# Patient Record
Sex: Female | Born: 1965 | Race: White | Hispanic: No | Smoking: Never smoker
Health system: Southern US, Community
[De-identification: ages and names within clinical notes are randomized; demographics above are authoritative.]

## PROBLEM LIST (undated history)

## (undated) ENCOUNTER — Ambulatory Visit: Discharge status: Absent without leave | Payer: Medicaid Other | Source: Home / Self Care

## (undated) DIAGNOSIS — N200 Calculus of kidney: Secondary | ICD-10-CM

## (undated) DIAGNOSIS — F4310 Post-traumatic stress disorder, unspecified: Secondary | ICD-10-CM

## (undated) DIAGNOSIS — F329 Major depressive disorder, single episode, unspecified: Secondary | ICD-10-CM

## (undated) DIAGNOSIS — D649 Anemia, unspecified: Secondary | ICD-10-CM

## (undated) DIAGNOSIS — F988 Other specified behavioral and emotional disorders with onset usually occurring in childhood and adolescence: Secondary | ICD-10-CM

## (undated) DIAGNOSIS — I499 Cardiac arrhythmia, unspecified: Secondary | ICD-10-CM

## (undated) DIAGNOSIS — F419 Anxiety disorder, unspecified: Secondary | ICD-10-CM

## (undated) DIAGNOSIS — F32A Depression, unspecified: Secondary | ICD-10-CM

## (undated) HISTORY — DX: Anemia, unspecified: D64.9

## (undated) HISTORY — DX: Depression, unspecified: F32.A

## (undated) HISTORY — DX: Calculus of kidney: N20.0

## (undated) HISTORY — DX: Post-traumatic stress disorder, unspecified: F43.10

## (undated) HISTORY — DX: Major depressive disorder, single episode, unspecified: F32.9

## (undated) HISTORY — PX: TENNIS ELBOW RELEASE/NIRSCHEL PROCEDURE: SHX6651

## (undated) HISTORY — PX: ABDOMINAL HYSTERECTOMY: SHX81

## (undated) HISTORY — PX: TUBAL LIGATION: SHX77

---

## 2013-07-19 ENCOUNTER — Emergency Department: Payer: Self-pay | Admitting: Emergency Medicine

## 2013-11-19 ENCOUNTER — Ambulatory Visit: Payer: Self-pay | Admitting: Family Medicine

## 2014-01-31 ENCOUNTER — Emergency Department: Payer: Self-pay | Admitting: Emergency Medicine

## 2014-08-22 ENCOUNTER — Ambulatory Visit
Admission: EM | Admit: 2014-08-22 | Discharge: 2014-08-22 | Disposition: A | Discharge status: Home / Self Care | Payer: Medicaid Other | Attending: Internal Medicine | Admitting: Internal Medicine

## 2014-08-22 DIAGNOSIS — F988 Other specified behavioral and emotional disorders with onset usually occurring in childhood and adolescence: Secondary | ICD-10-CM | POA: Insufficient documentation

## 2014-08-22 DIAGNOSIS — T50905A Adverse effect of unspecified drugs, medicaments and biological substances, initial encounter: Secondary | ICD-10-CM

## 2014-08-22 DIAGNOSIS — R001 Bradycardia, unspecified: Secondary | ICD-10-CM | POA: Diagnosis not present

## 2014-08-22 DIAGNOSIS — F419 Anxiety disorder, unspecified: Secondary | ICD-10-CM | POA: Diagnosis not present

## 2014-08-22 DIAGNOSIS — I499 Cardiac arrhythmia, unspecified: Secondary | ICD-10-CM | POA: Insufficient documentation

## 2014-08-22 DIAGNOSIS — R002 Palpitations: Secondary | ICD-10-CM | POA: Diagnosis present

## 2014-08-22 HISTORY — DX: Cardiac arrhythmia, unspecified: I49.9

## 2014-08-22 HISTORY — DX: Anxiety disorder, unspecified: F41.9

## 2014-08-22 HISTORY — DX: Other specified behavioral and emotional disorders with onset usually occurring in childhood and adolescence: F98.8

## 2014-08-22 MED ORDER — ATENOLOL 25 MG PO TABS: 12.5000 mg | ORAL_TABLET | Freq: Every day | ORAL | Status: DC

## 2014-08-22 NOTE — ED Notes (Signed)
[facility]. Put on Atenolol. Has appt with new PMD 09/06/14. States "feels winded when rhythm irregular." Denies chest pain. Skin pink, warm and dry.

## 2014-08-22 NOTE — ED Provider Notes (Signed)
CSN: [id number]     Arrival date & time 08/22/14  1225 History   First MD Initiated Contact with Patient 08/22/14 1331     Chief Complaint  Patient presents with  . Medication Refill   (Consider location/radiation/quality/duration/timing/severity/associated sxs/prior Treatment) HPI   49 year old female who presents with her daughter requesting an atenolol refill. Evidently she has been having palpitations was prescribed the atenolol to lessen these. Evidently she has been having occasional and intermittent shortness of breath and sweating and left arm tingling. On April 18 she was seen at the [facility] emergency room where she had a workup including an EKG troponins cardiac monitoring. At some point she had a Holter monitor placed which showed episodes of SVT. She was seen 2 weeks ago at [facility] for left arm tingling and EKG was obtained was normal and she was told by the ER physician that the tingling was most likely coming from a pinched nerve in her neck. An EKG indicated normal sinus rhythm according to the patient and that was the doctor that prescribed the atenolol. She has an appointment on 623 appointment at primary care for workup. Today she is not having any symptoms.  Past Medical History  Diagnosis Date  . Cardiac arrhythmia   . Attention deficit disorder (ADD)   . Anxiety    Past Surgical History  Procedure Laterality Date  . Tubal ligation     Family History  Problem Relation Age of Onset  . Adopted: Yes   History  Substance Use Topics  . Smoking status: Never Smoker   . Smokeless tobacco: Not on file  . Alcohol Use: No   OB History    No data available     Review of Systems  Constitutional: Positive for fatigue.  Respiratory: Positive for shortness of breath.   Cardiovascular: Positive for palpitations.  Neurological: Positive for dizziness.  All other systems reviewed and are negative.   Allergies  Codeine; Morphine and related; Oxycodone; and Oxycontin  Home  Medications   Prior to Admission medications   Medication Sig Start Date End Date Taking? Authorizing Provider  atenolol (TENORMIN) 25 MG tablet Take 0.5 tablets (12.5 mg total) by mouth daily. 08/22/14   Lutricia Feil, PA-C  atenolol (TENORMIN) 25 MG tablet Take 0.5 tablets (12.5 mg total) by mouth daily. 08/22/14   Chrissie Noa Roemer, PA-C   BP 141/76 mmHg  Pulse 55  Temp(Src) 98.2 F (36.8 C) (Oral)  Resp 18  Ht  (1.6 m)  Wt 190 lb (86.183 kg)  BMI 33.67 kg/m2  SpO2 100% Physical Exam  Constitutional: She is oriented to person, place, and time. She appears well-developed and well-nourished.  HENT:  Head: Normocephalic and atraumatic.  Eyes: EOM are normal. Pupils are equal, round, and reactive to light.  Cardiovascular:  Exam admission shows the patient in no distress. Carotid arteries show no bruits present. Lungs are clear to auscultation bilaterally. Auscultation of the heart shows no murmur no gallop. Is a normal S1-S2. The patient has a very slow rhythm.  Neurological: She is alert and oriented to person, place, and time. She has normal reflexes.  Skin: Skin is warm.  Psychiatric: She has a normal mood and affect. Her behavior is normal. Judgment and thought content normal.    ED Course  Procedures (including critical care time) Labs Review Labs Reviewed - No data to display  Imaging Review No results found.  EKG performed today because of profound bradycardia shows ventricular rate of 49 and  axis is normal. QTC is measured at 417 ms T-wave inversions are seen in lead 3 and aVR and otherwise there is no acute changes seen. MDM   1. Bradycardia, drug induced     New Prescriptions   ATENOLOL (TENORMIN) 25 MG TABLET    Take 0.5 tablets (12.5 mg total) by mouth daily.  Plan: 1. Test/x-ray results and diagnosis reviewed with patient 2. rx as per orders; risks, benefits, potential side effects reviewed with patient 3. Recommend supportive treatment with  4. F/u prn  if symptoms worsen or don't improve I advised the patient and her daughter that she needs to follow-up soon as possible as her symptoms that she's been having. It was recommended that they decrease the atenolol to one half dose daily cause of her bradycardia. If she has any increase in her symptoms or any change needs to go immediately to the emergency department.  Lutricia FeilWilliam P Roemer, PA-C 08/22/14 1414  Lutricia FeilWilliam P Roemer, PA-C 08/22/14 1432

## 2016-02-21 ENCOUNTER — Ambulatory Visit: Payer: Medicaid Other | Admitting: Physical Therapy

## 2016-03-03 ENCOUNTER — Ambulatory Visit: Payer: Medicaid Other | Admitting: Physical Therapy

## 2017-04-07 ENCOUNTER — Other Ambulatory Visit: Payer: Self-pay

## 2017-04-07 ENCOUNTER — Ambulatory Visit (INDEPENDENT_AMBULATORY_CARE_PROVIDER_SITE_OTHER): Payer: BLUE CROSS/BLUE SHIELD | Admitting: Nurse Practitioner

## 2017-04-07 ENCOUNTER — Encounter: Payer: Self-pay | Admitting: Nurse Practitioner

## 2017-04-07 VITALS — BP 137/69 | HR 63 | Temp 98.1°F | Ht 63.25 in | Wt 208.2 lb

## 2017-04-07 DIAGNOSIS — B349 Viral infection, unspecified: Secondary | ICD-10-CM | POA: Diagnosis not present

## 2017-04-07 DIAGNOSIS — G43019 Migraine without aura, intractable, without status migrainosus: Secondary | ICD-10-CM

## 2017-04-07 DIAGNOSIS — Z1231 Encounter for screening mammogram for malignant neoplasm of breast: Secondary | ICD-10-CM

## 2017-04-07 DIAGNOSIS — I471 Supraventricular tachycardia, unspecified: Secondary | ICD-10-CM

## 2017-04-07 DIAGNOSIS — Z1239 Encounter for other screening for malignant neoplasm of breast: Secondary | ICD-10-CM

## 2017-04-07 DIAGNOSIS — K219 Gastro-esophageal reflux disease without esophagitis: Secondary | ICD-10-CM | POA: Diagnosis not present

## 2017-04-07 DIAGNOSIS — Z1211 Encounter for screening for malignant neoplasm of colon: Secondary | ICD-10-CM

## 2017-04-07 MED ORDER — BUTALBITAL-APAP-CAFFEINE 50-325-40 MG PO TABS: 0.5000 | ORAL_TABLET | Freq: Two times a day (BID) | ORAL | 0 refills | Status: DC | PRN

## 2017-04-07 MED ORDER — OMEPRAZOLE 20 MG PO CPDR: 20.0000 mg | DELAYED_RELEASE_CAPSULE | Freq: Every day | ORAL | 2 refills | Status: DC

## 2017-04-07 MED ORDER — PROPRANOLOL HCL 10 MG PO TABS: 10.0000 mg | ORAL_TABLET | Freq: Two times a day (BID) | ORAL | 3 refills | Status: DC

## 2017-04-07 NOTE — Progress Notes (Signed)
Subjective:    Patient ID: Holly Bryant, female    DOB: November 14, 1965, 52 y.o.   MRN: [id number]  Holly Bryant is a 52 y.o. female presenting on 04/07/2017 for Establish Care (elevated bp) and Cough (x 3 days. Symptoms started with fever, diarrhea, vomiting and sweating. x 3 days)   HPI Establish Care New Provider Pt last seen by PCP many years ago, but has had care most recently through OB-GYN at [facility].  Obtain records from care everywhere. - Pt also sees Cardiology for palpiatations/SVT (160) has used atenolol for management.  Only lasts 10-15 seconds.  Worse with stress.  Skips a beat, flutters, then back to normal. No significant findings per pt on last holter monitor.  - Hypertension:  Readings 180/80 at work once about 2 weeks ago, but usually controlled - Migraines: fioricet - Still gets migraines, but high BP with migraines.  As long as BP is controlled, migraine frequency is much less. - GERD: Pt reports significant heartburn with nighttime awakenings 1-2x  Per night, wakes up 3 am and takes 1 hr to return to sleep.  Acute illness Pt reports now resolving acute illness that was associated with following symptoms: cough, fever, chills, sweats, diarrhea, vomiting.  She is now improving and believes this to be resolving.  Past Medical History:  Diagnosis Date  . Anemia    before hysterectomy  . Anxiety   . Attention deficit disorder (ADD)   . Cardiac arrhythmia    SVT - followed by PCP  . Depression   . Nephrolithiasis   . PTSD (post-traumatic stress disorder)    Past Surgical History:  Procedure Laterality Date  . ABDOMINAL HYSTERECTOMY    . COLONOSCOPY WITH PROPOFOL N/A 04/21/2017   Procedure: COLONOSCOPY WITH PROPOFOL;  Surgeon: Toney Reil, MD;  Location: [facility];  Service: Endoscopy;  Laterality: N/A;  . TENNIS ELBOW RELEASE/NIRSCHEL PROCEDURE Right   . TUBAL LIGATION     Social History   Socioeconomic History  . Marital status: Married      Spouse name: Not on file  . Number of children: Not on file  . Years of education: Not on file  . Highest education level: Not on file  Social Needs  . Financial resource strain: Not on file  . Food insecurity - worry: Not on file  . Food insecurity - inability: Not on file  . Transportation needs - medical: Not on file  . Transportation needs - non-medical: Not on file  Occupational History  . Not on file  Tobacco Use  . Smoking status: Never Smoker  . Smokeless tobacco: Never Used  Substance and Sexual Activity  . Alcohol use: No  . Drug use: No  . Sexual activity: Yes  Other Topics Concern  . Not on file  Social History Narrative  . Not on file   Family History  Adopted: Yes  Problem Relation Age of Onset  . Colon cancer Mother   . Heart attack Mother   . Colon cancer Brother 50  . Chronic Renal Failure Maternal Grandmother   . Diabetes Maternal Grandmother    Current Outpatient Medications on File Prior to Visit  Medication Sig  . Multiple Vitamin (MULTIVITAMIN) tablet Take 1 tablet by mouth daily.   No current facility-administered medications on file prior to visit.     Review of Systems  Constitutional: Positive for chills, diaphoresis and fever. Negative for activity change and unexpected weight change.  HENT: Positive for congestion and  rhinorrhea.   Eyes: Negative.   Respiratory: Positive for cough. Negative for shortness of breath and wheezing.   Cardiovascular: Positive for palpitations.  Gastrointestinal: Positive for abdominal pain (regular heartburn), diarrhea and nausea.  Endocrine: Negative.   Genitourinary: Negative.   Musculoskeletal: Negative.   Skin: Negative.   Allergic/Immunologic: Negative.   Neurological: Positive for headaches (migraine history, but none currently).  Hematological: Negative.   Psychiatric/Behavioral: Negative.    Per HPI unless specifically indicated above      Objective:    BP 137/69 (BP Location: Right Arm,  Patient Position: Sitting, Cuff Size: Large)   Pulse 63   Temp 98.1 F (36.7 C) (Oral)   Ht 5' 3.25" (1.607 m)   Wt 208 lb 3.2 oz (94.4 kg)   BMI 36.59 kg/m   Wt Readings from Last 3 Encounters:  04/21/17 203 lb (92.1 kg)  04/07/17 208 lb 3.2 oz (94.4 kg)  08/22/14 190 lb (86.2 kg)    Physical Exam  General - obese, well-appearing, NAD HEENT - Normocephalic, atraumatic, PERRL, EOMI, patent nares w/o congestion, oropharynx clear, MMM, TM normal, Ear canal normal, external ear normal w/o lesions Neck - supple, non-tender, no cervical LAD, no thyromegaly, no carotid bruit Heart - RRR, no murmurs heard Lungs - Clear throughout all lobes, no wheezing, crackles, or rhonchi. Normal work of breathing. Abdomen - soft, NTND, no masses, no hepatosplenomegaly, active bowel sounds Extremeties - non-tender, no edema, cap refill < 2 seconds, peripheral pulses intact +2 bilaterally Skin - warm, dry, no rashes Neuro - awake, alert, oriented x3, normal gait Psych - Normal mood and affect, normal behavior      Assessment & Plan:   Problem List Items Addressed This Visit    None    Visit Diagnoses    SVT (supraventricular tachycardia) (HCC)    -  Primary Currently well controlled, but pt experiencing continued episodes of intermittent palpitations which could be SVT.  Pt is not currently taking any medications.  Plan: 1. START propranolol 10 mg bid for palpitations.  May also have secondary benefit of migraine prophylaxis. 2. Begin physical activity slowly.  Increased to 30 minutes most days of week. 3. Followup 6 weeks   Relevant Medications   propranolol (INDERAL) 10 MG tablet   Intractable migraine without aura and without status migrainosus     Consistent with episodic migraine HA  - Currently without active HA, well-appearing, no focal neuro deficits, tolerating PO w/o n/v - Inadequately treated for migraine HA at home, but has partial response with treatment by fioricet 1/2- 1 tablet  prn.  Plan: 1. Continue abortive therapy with Firoicet 1/2-1 tablet po prn migraine at onset.  Repeat x 1 additional dose in 24 hours if needed. Refill provided - Recommend taking Tylenol 1000mg  TID alternatively 2. START propranolol as above.  Can consider higher dose as needed for migraine prophylaxis. 3. Avoid triggers including foods, caffeine. Important to rest. 4. Start headache diary, handout given, identify triggers for avoidance, bring to next visit 5. Return criteria given for acute migraine, when to go to office vs ED   Relevant Medications   propranolol (INDERAL) 10 MG tablet   butalbital-acetaminophen-caffeine (FIORICET, ESGIC) 50-325-40 MG tablet   Colon cancer screening     Pt requiring colon cancer screening.  No family history of colon cancer.  Plan: - Discussed timing for initiation of colon cancer screening ACS vs USPSTF guidelines - Mutual decision making discussion for options of colonoscopy vs cologuard.  Pt prefers colonoscopy.  -  Referral to GI placed.   Relevant Orders   Ambulatory referral to Gastroenterology   Breast cancer screening     Pt last mammogram > 2 years ago in 2012.  Plan: 1. Screening mammogram order placed.  Pt will call to schedule appointment.  Information given.   Relevant Orders   MM DIGITAL SCREENING BILATERAL   Gastroesophageal reflux disease, esophagitis presence not specified     Currently uncontrolled on no medication.  Pt frequently eating late at night prior to bed.  Plan: 1. Start omeprazole 20 mg once daily x 14 days.  Consider long-term use in future if effective. Side effects discussed. Pt wants to continue med. 2. Avoid diet triggers. Reviewed need to seek care if globus sensation, difficulty swallowing, s/sx of GI bleed. 3. Follow up as needed and in 6 months.    Relevant Medications   omeprazole (PRILOSEC) 20 MG capsule   Acute viral syndrome     Resolving.  Pt is not currently concerned and feels it will not need any  additional treatment.      Meds ordered this encounter  Medications  . propranolol (INDERAL) 10 MG tablet    Sig: Take 1 tablet (10 mg total) by mouth 2 (two) times daily.    Dispense:  60 tablet    Refill:  3    Order Specific Question:   Supervising Provider    Answer:   Smitty CordsKARAMALEGOS, ALEXANDER J [[id number]]  . butalbital-acetaminophen-caffeine (FIORICET, ESGIC) 50-325-40 MG tablet    Sig: Take 0.5-1 tablets by mouth 2 (two) times daily as needed for migraine.    Dispense:  14 tablet    Refill:  0    Order Specific Question:   Supervising Provider    Answer:   Smitty CordsKARAMALEGOS, ALEXANDER J [[id number]]  . omeprazole (PRILOSEC) 20 MG capsule    Sig: Take 1 capsule (20 mg total) by mouth daily.    Dispense:  30 capsule    Refill:  2    Order Specific Question:   Supervising Provider    Answer:   Smitty CordsKARAMALEGOS, ALEXANDER J [[id number]]      Follow up plan: Return in about 3 months (around 07/06/2017) for annual physical AND 6 weeks for palpitations.  Wilhelmina McardleLauren Hoover Grewe, DNP, AGPCNP-BC Adult Gerontology Primary Care Nurse Practitioner [facility] 05/03/2017, 1:08 PM

## 2017-04-07 NOTE — Patient Instructions (Signed)
Holly Bryant, Thank you for coming in to clinic today.  1. For your heartburn:  - START omeprazole 20 mg once daily for 14 days.  If you continue having heartburn, can continue this for up to 3 months.  2. For heartrate: - START propranolol 10 mg twice daily to prevent SVT/heart racing. - May begin physcial activity slowly with 15 minutes followed by rest. Increase to 30-40 minutes max per session by next visit.  3. Migraines: - Resume fioricet 1/2 -1 tablet as needed at onset of migraine.  May take twice daily if needed beyond single dose.  4. Your mammogram order has been placed.  Call the Scheduling phone number at [phone] to schedule your mammogram at your convenience.  You can choose to go to either location listed below.  Let the scheduler know which location you prefer.  [facility]  [facility]  [address]  [place], [place] [address]   [facility] [place] [facility] [address] [place], [place] [address]  5. For colonoscopy:  - [facility] in [place] will call to schedule your appointment.   Please schedule a follow-up appointment with Wilhelmina McardleLauren Bowe Sidor, AGNP. Return in about 3 months (around 07/06/2017) for annual physical AND 6 weeks for palpitations.  If you have any other questions or concerns, please feel free to call the clinic or send a message through MyChart. You may also schedule an earlier appointment if necessary.  You will receive a survey after today's visit either digitally by e-mail or paper by [employer] mail. Your experiences and feedback matter to us.  Please respond so we know how we are doing as we provide care for you.   Wilhelmina McardleLauren Alzina Golda, DNP, AGNP-BC Adult Gerontology Nurse Practitioner [facility], [facility]

## 2017-04-09 ENCOUNTER — Other Ambulatory Visit: Payer: Self-pay

## 2017-04-09 ENCOUNTER — Telehealth: Payer: Self-pay

## 2017-04-09 DIAGNOSIS — Z1211 Encounter for screening for malignant neoplasm of colon: Secondary | ICD-10-CM

## 2017-04-09 NOTE — Telephone Encounter (Signed)
Gastroenterology Pre-Procedure Review  Request Date: 04/21/17 Requesting Physician: Dr. Allegra LaiVanga  PATIENT REVIEW QUESTIONS: The patient responded to the following health history questions as indicated:    1. Are you having any GI issues? no 2. Do you have a personal history of Polyps? no 3. Do you have a family history of Colon Cancer or Polyps? yes (sister polyps mom colon cancer) 4. Diabetes Mellitus? no 5. Joint replacements in the past 12 months?no 6. Major health problems in the past 3 months?yes (svt 04/07/17) 7. Any artificial heart valves, MVP, or defibrillator?no    MEDICATIONS & ALLERGIES:    Patient reports the following regarding taking any anticoagulation/antiplatelet therapy:   Plavix, Coumadin, Eliquis, Xarelto, Lovenox, Pradaxa, Brilinta, or Effient? no Aspirin? yes (81 mg)  Patient confirms/reports the following medications:  Current Outpatient Medications  Medication Sig Dispense Refill  . butalbital-acetaminophen-caffeine (FIORICET, ESGIC) 50-325-40 MG tablet Take 0.5-1 tablets by mouth 2 (two) times daily as needed for migraine. 14 tablet 0  . Multiple Vitamin (MULTIVITAMIN) tablet Take 1 tablet by mouth daily.    Marland Kitchen. omeprazole (PRILOSEC) 20 MG capsule Take 1 capsule (20 mg total) by mouth daily. 30 capsule 2  . propranolol (INDERAL) 10 MG tablet Take 1 tablet (10 mg total) by mouth 2 (two) times daily. 60 tablet 3   No current facility-administered medications for this visit.     Patient confirms/reports the following allergies:  Allergies  Allergen Reactions  . Codeine Nausea Only  . Morphine And Related Nausea Only  . Oxycodone Nausea And Vomiting  . Oxycontin [Oxycodone Hcl] Nausea Only  . Sumatriptan Other (See Comments)    No orders of the defined types were placed in this encounter.   AUTHORIZATION INFORMATION Primary Insurance: 1D#: Group #:  Secondary Insurance: 1D#: Group #:  SCHEDULE INFORMATION: Date: 04/21/17 Time: Location:[facility]

## 2017-04-13 ENCOUNTER — Encounter: Payer: Self-pay | Admitting: Nurse Practitioner

## 2017-04-15 ENCOUNTER — Encounter: Payer: Self-pay | Admitting: *Deleted

## 2017-04-15 ENCOUNTER — Other Ambulatory Visit: Payer: Self-pay

## 2017-04-20 NOTE — Discharge Instructions (Signed)
General Anesthesia, Adult, Care After °These instructions provide you with information about caring for yourself after your procedure. Your health care provider may also give you more specific instructions. Your treatment has been planned according to current medical practices, but problems sometimes occur. Call your health care provider if you have any problems or questions after your procedure. °What can I expect after the procedure? °After the procedure, it is common to have: °· Vomiting. °· A sore throat. °· Mental slowness. ° °It is common to feel: °· Nauseous. °· Cold or shivery. °· Sleepy. °· Tired. °· Sore or achy, even in parts of your body where you did not have surgery. ° °Follow these instructions at home: °For at least 24 hours after the procedure: °· Do not: °? Participate in activities where you could fall or become injured. °? Drive. °? Use heavy machinery. °? Drink alcohol. °? Take sleeping pills or medicines that cause drowsiness. °? Make important decisions or sign legal documents. °? Take care of children on your own. °· Rest. °Eating and drinking °· If you vomit, drink water, juice, or soup when you can drink without vomiting. °· Drink enough fluid to keep your urine clear or pale yellow. °· Make sure you have little or no nausea before eating solid foods. °· Follow the diet recommended by your health care provider. °General instructions °· Have a responsible adult stay with you until you are awake and alert. °· Return to your normal activities as told by your health care provider. Ask your health care provider what activities are safe for you. °· Take over-the-counter and prescription medicines only as told by your health care provider. °· If you smoke, do not smoke without supervision. °· Keep all follow-up visits as told by your health care provider. This is important. °Contact a health care provider if: °· You continue to have nausea or vomiting at home, and medicines are not helpful. °· You  cannot drink fluids or start eating again. °· You cannot urinate after 8-12 hours. °· You develop a skin rash. °· You have fever. °· You have increasing redness at the site of your procedure. °Get help right away if: °· You have difficulty breathing. °· You have chest pain. °· You have unexpected bleeding. °· You feel that you are having a life-threatening or urgent problem. °This information is not intended to replace advice given to you by your health care provider. Make sure you discuss any questions you have with your health care provider. °Document Released: 06/08/2000 Document Revised: 08/05/2015 Document Reviewed: 02/14/2015 °Elsevier Interactive Patient Education © 2018 [employer] ° °

## 2017-04-21 ENCOUNTER — Ambulatory Visit
Admission: RE | Admit: 2017-04-21 | Discharge: 2017-04-21 | Disposition: A | Discharge status: Home / Self Care | Payer: BLUE CROSS/BLUE SHIELD | Source: Ambulatory Visit | Attending: Gastroenterology | Admitting: Gastroenterology

## 2017-04-21 ENCOUNTER — Ambulatory Visit: Payer: BLUE CROSS/BLUE SHIELD | Admitting: Anesthesiology

## 2017-04-21 ENCOUNTER — Encounter
Admission: RE | Disposition: A | Discharge status: Home / Self Care | Payer: Self-pay | Source: Ambulatory Visit | Attending: Gastroenterology

## 2017-04-21 DIAGNOSIS — Z1211 Encounter for screening for malignant neoplasm of colon: Secondary | ICD-10-CM | POA: Diagnosis present

## 2017-04-21 DIAGNOSIS — Z800 Family history of malignant neoplasm of digestive organs: Secondary | ICD-10-CM

## 2017-04-21 DIAGNOSIS — Z7982 Long term (current) use of aspirin: Secondary | ICD-10-CM | POA: Diagnosis not present

## 2017-04-21 HISTORY — PX: COLONOSCOPY WITH PROPOFOL: SHX5780

## 2017-04-21 SURGERY — COLONOSCOPY WITH PROPOFOL
Anesthesia: General | Wound class: Contaminated

## 2017-04-21 MED ORDER — PROMETHAZINE HCL 25 MG/ML IJ SOLN: 6.2500 mg | INTRAMUSCULAR | Status: DC | PRN

## 2017-04-21 MED ORDER — LACTATED RINGERS IV SOLN
INTRAVENOUS | Status: DC
  Administered 2017-04-21: 11:00:00 via INTRAVENOUS

## 2017-04-21 MED ORDER — STERILE WATER FOR IRRIGATION IR SOLN
Status: DC | PRN
  Administered 2017-04-21: 12:00:00

## 2017-04-21 MED ORDER — LIDOCAINE HCL (CARDIAC) 20 MG/ML IV SOLN
INTRAVENOUS | Status: DC | PRN
  Administered 2017-04-21: 40 mg via INTRAVENOUS

## 2017-04-21 MED ORDER — FENTANYL CITRATE (PF) 100 MCG/2ML IJ SOLN: 25.0000 ug | INTRAMUSCULAR | Status: DC | PRN

## 2017-04-21 MED ORDER — MEPERIDINE HCL 25 MG/ML IJ SOLN: 6.2500 mg | INTRAMUSCULAR | Status: DC | PRN

## 2017-04-21 MED ORDER — PROPOFOL 10 MG/ML IV BOLUS
INTRAVENOUS | Status: DC | PRN
  Administered 2017-04-21 (×2): 30 mg via INTRAVENOUS
  Administered 2017-04-21: 20 mg via INTRAVENOUS
  Administered 2017-04-21: 40 mg via INTRAVENOUS
  Administered 2017-04-21: 20 mg via INTRAVENOUS
  Administered 2017-04-21: 30 mg via INTRAVENOUS
  Administered 2017-04-21: 90 mg via INTRAVENOUS
  Administered 2017-04-21: 30 mg via INTRAVENOUS
  Administered 2017-04-21: 20 mg via INTRAVENOUS
  Administered 2017-04-21: 40 mg via INTRAVENOUS
  Administered 2017-04-21 (×3): 30 mg via INTRAVENOUS

## 2017-04-21 SURGICAL SUPPLY — 25 items
CANISTER SUCT 1200ML W/VALVE (MISCELLANEOUS) ×3 IMPLANT
CLIP HMST 235XBRD CATH ROT (MISCELLANEOUS) IMPLANT
CLIP RESOLUTION 360 11X235 (MISCELLANEOUS)
ELECT REM PT RETURN 9FT ADLT (ELECTROSURGICAL)
ELECTRODE REM PT RTRN 9FT ADLT (ELECTROSURGICAL) IMPLANT
FCP ESCP3.2XJMB 240X2.8X (MISCELLANEOUS)
FORCEPS BIOP RAD 4 LRG CAP 4 (CUTTING FORCEPS) IMPLANT
FORCEPS BIOP RJ4 240 W/NDL (MISCELLANEOUS)
FORCEPS ESCP3.2XJMB 240X2.8X (MISCELLANEOUS) IMPLANT
GOWN CVR UNV OPN BCK APRN NK (MISCELLANEOUS) ×2 IMPLANT
GOWN ISOL THUMB LOOP REG UNIV (MISCELLANEOUS) ×4
INJECTOR VARIJECT VIN23 (MISCELLANEOUS) IMPLANT
KIT DEFENDO VALVE AND CONN (KITS) IMPLANT
KIT ENDO PROCEDURE OLY (KITS) ×3 IMPLANT
MARKER SPOT ENDO TATTOO 5ML (MISCELLANEOUS) IMPLANT
PROBE APC STR FIRE (PROBE) IMPLANT
RETRIEVER NET ROTH 2.5X230 LF (MISCELLANEOUS) IMPLANT
SNARE COLD EXACTO (MISCELLANEOUS) IMPLANT
SNARE SHORT THROW 13M SML OVAL (MISCELLANEOUS) IMPLANT
SNARE SHORT THROW 30M LRG OVAL (MISCELLANEOUS) IMPLANT
SNARE SNG USE RND 15MM (INSTRUMENTS) IMPLANT
SPOT EX ENDOSCOPIC TATTOO (MISCELLANEOUS)
TRAP ETRAP POLY (MISCELLANEOUS) IMPLANT
VARIJECT INJECTOR VIN23 (MISCELLANEOUS)
WATER STERILE IRR 250ML POUR (IV SOLUTION) ×3 IMPLANT

## 2017-04-21 NOTE — Anesthesia Procedure Notes (Signed)
Performed by: Maree KrabbeWarren, Trajon Rosete, CRNA Pre-anesthesia Checklist: Patient identified, Emergency Drugs available, Suction available, Patient being monitored and Timeout performed Patient Re-evaluated:Patient Re-evaluated prior to induction Oxygen Delivery Method: Nasal cannula Placement Confirmation: breath sounds checked- equal and bilateral and positive ETCO2

## 2017-04-21 NOTE — Op Note (Signed)
[facility] Patient Name: Holly Bryant Procedure Date: 04/21/2017 11:45 AM MRN: [id number] Account #: [id number] Date of Birth: July 04, 1965 Admit Type: Outpatient Age: 52 Room: [facility] OR ROOM 01 Gender: Female Note Status: Finalized Procedure:            Colonoscopy Indications:          Screening in patient at increased risk: Colorectal                        cancer in mother 82 or older, Screening in patient at                        increased risk: Colorectal cancer in brother before age                        16 Providers:            Daisee Centner Raeanne Gathers MD, MD Referring MD:         Olin Hauser (Referring MD) Medicines:            Monitored Anesthesia Care Complications:        No immediate complications. Estimated blood loss: None. Procedure:            Pre-Anesthesia Assessment:                       - Prior to the procedure, a History and Physical was                        performed, and patient medications and allergies were                        reviewed. The patient is competent. The risks and                        benefits of the procedure and the sedation options and                        risks were discussed with the patient. All questions                        were answered and informed consent was obtained.                        Patient identification and proposed procedure were                        verified by the physician, the nurse, the                        anesthesiologist, the anesthetist and the technician in                        the pre-procedure area in the procedure room. Mental                        Status Examination: alert and oriented. Airway                        Examination: normal oropharyngeal airway and neck  mobility. Respiratory Examination: clear to                        auscultation. CV Examination: normal. Prophylactic                        Antibiotics: The  patient does not require prophylactic                        antibiotics. Prior Anticoagulants: The patient has                        taken no previous anticoagulant or antiplatelet agents.                        ASA Grade Assessment: II - A patient with mild systemic                        disease. After reviewing the risks and benefits, the                        patient was deemed in satisfactory condition to undergo                        the procedure. The anesthesia plan was to use monitored                        anesthesia care (MAC). Immediately prior to                        administration of medications, the patient was                        re-assessed for adequacy to receive sedatives. The                        heart rate, respiratory rate, oxygen saturations, blood                        pressure, adequacy of pulmonary ventilation, and                        response to care were monitored throughout the                        procedure. The physical status of the patient was                        re-assessed after the procedure.                       After obtaining informed consent, the colonoscope was                        passed under direct vision. Throughout the procedure,                        the patient's blood pressure, pulse, and oxygen                        saturations were monitored  continuously. The Olympus                        CF-HQ190L Colonoscope (S#. [phone]) was introduced                        through the anus and advanced to the the cecum,                        identified by appendiceal orifice and ileocecal valve.                        The colonoscopy was performed without difficulty. The                        patient tolerated the procedure well. The quality of                        the bowel preparation was poor. Findings:      The perianal and digital rectal examinations were normal. Pertinent       negatives include normal sphincter  tone and no palpable rectal lesions.      A large amounts of thick adherent semi-liquid stool was found in the       transverse colon, in the ascending colon and in the cecum, interfering       with visualization, unabe to wash off with avage.      No tumor or large polyps were identified      Unable to perform retrofexion      The exam was otherwise without abnormality. Impression:           - Preparation of the colon was poor.                       - Stool in the transverse colon, in the ascending colon                        and in the cecum.                       - The examination was otherwise normal.                       - No specimens collected. Recommendation:       - Repeat colonoscopy at the next available appointment                        because the bowel preparation was poor.                       - Recommend 2 day bowe prep Procedure Code(s):    --- Professional ---                       X7353, Colorectal cancer screening; colonoscopy on                        individual at high risk Diagnosis Code(s):    --- Professional ---                       Z80.0, Family history of malignant neoplasm of  digestive organs CPT copyright 2016 American Medical Association. All rights reserved. The codes documented in this report are preliminary and upon coder review may  be revised to meet current compliance requirements. Dr. Ulyess Mort Lin Landsman MD, MD 04/21/2017 12:14:23 PM This report has been signed electronically. Number of Addenda: 0 Note Initiated On: 04/21/2017 11:45 AM Scope Withdrawal Time: 0 hours 7 minutes 29 seconds  Total Procedure Duration: 0 hours 12 minutes 56 seconds       [facility]

## 2017-04-21 NOTE — Transfer of Care (Signed)
Immediate Anesthesia Transfer of Care Note  [facility]  Procedure(s) Performed: COLONOSCOPY WITH PROPOFOL (N/A )  Patient Location: PACU  Anesthesia Type: General  Level of Consciousness: awake, alert  and patient cooperative  Airway and Oxygen Therapy: Patient Spontanous Breathing and Patient connected to supplemental oxygen  Post-op Assessment: Post-op Vital signs reviewed, Patient's Cardiovascular Status Stable, Respiratory Function Stable, Patent Airway and No signs of Nausea or vomiting  Post-op Vital Signs: Reviewed and stable  Complications: No apparent anesthesia complications

## 2017-04-21 NOTE — Anesthesia Postprocedure Evaluation (Signed)
Anesthesia Post Note  Patient: Holly Bryant  Procedure(s) Performed: COLONOSCOPY WITH PROPOFOL (N/A )  Patient location during evaluation: PACU Anesthesia Type: General Level of consciousness: awake and alert Pain management: pain level controlled Vital Signs Assessment: post-procedure vital signs reviewed and stable Respiratory status: spontaneous breathing, nonlabored ventilation, respiratory function stable and patient connected to nasal cannula oxygen Cardiovascular status: blood pressure returned to baseline and stable Postop Assessment: no apparent nausea or vomiting Anesthetic complications: no    SCOURAS, NICOLE ELAINE

## 2017-04-21 NOTE — Anesthesia Preprocedure Evaluation (Signed)
Anesthesia Evaluation  Patient identified by MRN, date of birth, ID band Patient awake    Reviewed: Allergy & Precautions, H&P , NPO status , Patient's Chart, lab work & pertinent test results, reviewed documented beta blocker date and time   Airway Mallampati: II  TM Distance: >3 FB Neck ROM: full    Dental no notable dental hx.    Pulmonary neg pulmonary ROS,    Pulmonary exam normal breath sounds clear to auscultation       Cardiovascular Exercise Tolerance: Good + dysrhythmias Supra Ventricular Tachycardia  Rhythm:regular Rate:Normal     Neuro/Psych PSYCHIATRIC DISORDERS (Depression. PTSD) negative neurological ROS     GI/Hepatic negative GI ROS, Neg liver ROS,   Endo/Other  negative endocrine ROS  Renal/GU negative Renal ROS  negative genitourinary   Musculoskeletal   Abdominal   Peds  Hematology  (+) anemia ,   Anesthesia Other Findings   Reproductive/Obstetrics negative OB ROS                             Anesthesia Physical Anesthesia Plan  ASA: II  Anesthesia Plan: General   Post-op Pain Management:    Induction:   PONV Risk Score and Plan:   Airway Management Planned:   Additional Equipment:   Intra-op Plan:   Post-operative Plan:   Informed Consent: I have reviewed the patients History and Physical, chart, labs and discussed the procedure including the risks, benefits and alternatives for the proposed anesthesia with the patient or authorized representative who has indicated his/her understanding and acceptance.   Dental Advisory Given  Plan Discussed with: CRNA  Anesthesia Plan Comments:         Anesthesia Quick Evaluation

## 2017-04-21 NOTE — H&P (Signed)
Arlyss Repress, MD [address]  [address]  [place], [place] [address]  Main: [phone]  Fax: [phone] Pager: [phone]  Primary Care Physician:  Galen Manila, NP Primary Gastroenterologist:  Dr. Arlyss Repress  Pre-Procedure History & Physical: HPI:  Holly Bryant is a 52 y.o. female is here for an colonoscopy.   Past Medical History:  Diagnosis Date  . Anemia    before hysterectomy  . Anxiety   . Attention deficit disorder (ADD)   . Cardiac arrhythmia    SVT - followed by PCP  . Depression   . Nephrolithiasis   . PTSD (post-traumatic stress disorder)     Past Surgical History:  Procedure Laterality Date  . ABDOMINAL HYSTERECTOMY    . TENNIS ELBOW RELEASE/NIRSCHEL PROCEDURE Right   . TUBAL LIGATION      Prior to Admission medications   Medication Sig Start Date End Date Taking? Authorizing Provider  aspirin 81 MG tablet Take 81 mg by mouth daily.   Yes [provider]  Multiple Vitamin (MULTIVITAMIN) tablet Take 1 tablet by mouth daily.   Yes [provider]  omeprazole (PRILOSEC) 20 MG capsule Take 1 capsule (20 mg total) by mouth daily. 04/07/17  Yes Galen Manila, NP  propranolol (INDERAL) 10 MG tablet Take 1 tablet (10 mg total) by mouth 2 (two) times daily. 04/07/17  Yes Galen Manila, NP  butalbital-acetaminophen-caffeine (FIORICET, ESGIC) [phone] MG tablet Take 0.5-1 tablets by mouth 2 (two) times daily as needed for migraine. 04/07/17   Galen Manila, NP    Allergies as of 04/09/2017 - Review Complete 04/07/2017  Allergen Reaction Noted  . Codeine Nausea Only 08/22/2014  . Morphine and related Nausea Only 08/22/2014  . Oxycodone Nausea And Vomiting 08/22/2014  . Oxycontin [oxycodone hcl] Nausea Only 08/22/2014  . Sumatriptan Other (See Comments) 10/17/2014    Family History  Adopted: Yes  Problem Relation Age of Onset  . Colon cancer Mother   . Heart attack Mother   . Colon cancer  Brother 50  . Chronic Renal Failure Maternal Grandmother   . Diabetes Maternal Grandmother     Social History   Socioeconomic History  . Marital status: Married    Spouse name: Not on file  . Number of children: Not on file  . Years of education: Not on file  . Highest education level: Not on file  Social Needs  . Financial resource strain: Not on file  . Food insecurity - worry: Not on file  . Food insecurity - inability: Not on file  . Transportation needs - medical: Not on file  . Transportation needs - non-medical: Not on file  Occupational History  . Not on file  Tobacco Use  . Smoking status: Never Smoker  . Smokeless tobacco: Never Used  Substance and Sexual Activity  . Alcohol use: No  . Drug use: No  . Sexual activity: Yes  Other Topics Concern  . Not on file  Social History Narrative  . Not on file    Review of Systems: See HPI, otherwise negative ROS  Physical Exam: Ht 5' 3.25" (1.607 m)   Wt 208 lb (94.3 kg)   BMI 36.55 kg/m  General:   Alert,  pleasant and cooperative in NAD Head:  Normocephalic and atraumatic. Neck:  Supple; no masses or thyromegaly. Lungs:  Clear throughout to auscultation.    Heart:  Regular rate and rhythm. Abdomen:  Soft, nontender and nondistended. Normal bowel sounds, without  guarding, and without rebound.   Neurologic:  Alert and  oriented x4;  grossly normal neurologically.  Impression/Plan: Holly Bryant is here for an colonoscopy to be performed for colon cancer screening  Risks, benefits, limitations, and alternatives regarding  colonoscopy have been reviewed with the patient.  Questions have been answered.  All parties agreeable.   Lannette Donathohini Medardo Hassing, MD  04/21/2017, 11:12 AM

## 2017-05-03 ENCOUNTER — Encounter: Payer: Self-pay | Admitting: Nurse Practitioner

## 2017-05-03 DIAGNOSIS — I471 Supraventricular tachycardia: Secondary | ICD-10-CM | POA: Insufficient documentation

## 2017-05-03 DIAGNOSIS — G43019 Migraine without aura, intractable, without status migrainosus: Secondary | ICD-10-CM | POA: Insufficient documentation

## 2017-05-03 DIAGNOSIS — K219 Gastro-esophageal reflux disease without esophagitis: Secondary | ICD-10-CM | POA: Insufficient documentation

## 2017-05-19 ENCOUNTER — Ambulatory Visit (INDEPENDENT_AMBULATORY_CARE_PROVIDER_SITE_OTHER): Payer: BLUE CROSS/BLUE SHIELD | Admitting: Nurse Practitioner

## 2017-05-19 ENCOUNTER — Other Ambulatory Visit: Payer: Self-pay

## 2017-05-19 ENCOUNTER — Encounter: Payer: Self-pay | Admitting: Nurse Practitioner

## 2017-05-19 VITALS — BP 132/77 | HR 53 | Temp 98.1°F | Ht 63.25 in | Wt 208.4 lb

## 2017-05-19 DIAGNOSIS — R002 Palpitations: Secondary | ICD-10-CM

## 2017-05-19 DIAGNOSIS — F4010 Social phobia, unspecified: Secondary | ICD-10-CM | POA: Diagnosis not present

## 2017-05-19 DIAGNOSIS — G43019 Migraine without aura, intractable, without status migrainosus: Secondary | ICD-10-CM

## 2017-05-19 MED ORDER — PROPRANOLOL HCL 10 MG PO TABS: 5.0000 mg | ORAL_TABLET | Freq: Three times a day (TID) | ORAL | 3 refills | Status: DC

## 2017-05-19 MED ORDER — BUSPIRONE HCL 7.5 MG PO TABS: 7.5000 mg | ORAL_TABLET | Freq: Three times a day (TID) | ORAL | 5 refills | Status: DC

## 2017-05-19 NOTE — Progress Notes (Signed)
Subjective:    Patient ID: Holly Bryant, female    DOB: 08-29-1965, 52 y.o.   MRN: [id number]  Holly Bryant is a 52 y.o. female presenting on 05/19/2017 for Palpitations (pt states if she doesn't take her propanlol the heart palpation returns )   HPI Palpitations Pt has had prior cardiology evaluation with holter monitor that did not show correlation of EKG changes other than few PAC/PVCs with symptoms of palpitations.  Pt had since had additional palpitations and presented to me on 04/07/2017 with more frequent palpitations.   She was started on propranolol 10 mg tid, but was unable to take full dose tid.  She is taking 1/2 tab in am, 1/2 tab midday, 1 tab at bedtime.  She had sluggishness/fatigue when taking full dose.  Notes improvement in palpitations when taking medication consistently.  If she misses her mid-day dose, she has palpitations at work and has to go take her medication.   She has also gotten a wearable heart rate tracker/exercise monitor that shows regular daily HR increases to 110-120 even without activity.  Occasionally, pt states it is happening more than once per day.  Pt does not always correlate this to palpitations, however.  Also notes no consistent time of day that they occur.   Migraines Pt notes significant improvement in her migraine symptoms since starting propranolol.  She reports no headaches.   Anxiety Pt also reports propranolol is helping social anxiety.  It allows her to calm down, but she still has overwhelming social anxiety.  She had previously taken clonazepam when working through a divorce, but does not desire to resume this medication.  Will consider long-term coverage for medication for anxiety as it is impacting her ability to function at work some days.  She resorts to leaving social situations or other circumstances where she has to interact with lots of people.  Social History   Tobacco Use  . Smoking status: Never Smoker  . Smokeless  tobacco: Never Used  Substance Use Topics  . Alcohol use: No  . Drug use: No    Review of Systems Per HPI unless specifically indicated above     Objective:    BP 132/77 (BP Location: Right Arm, Patient Position: Sitting, Cuff Size: Large)   Pulse (!) 53   Temp 98.1 F (36.7 C) (Oral)   Ht 5' 3.25" (1.607 m)   Wt 208 lb 6.4 oz (94.5 kg)   BMI 36.63 kg/m   Wt Readings from Last 3 Encounters:  05/19/17 208 lb 6.4 oz (94.5 kg)  04/21/17 203 lb (92.1 kg)  04/07/17 208 lb 3.2 oz (94.4 kg)    Physical Exam  General - obese with central adiposity, well-appearing, NAD HEENT - Normocephalic, atraumatic Neck - supple, non-tender, no LAD, no thyromegaly, no carotid bruit Heart - RRR, no murmurs heard Lungs - Clear throughout all lobes, no wheezing, crackles, or rhonchi. Normal work of breathing. Extremeties - non-tender, no edema, cap refill < 2 seconds, peripheral pulses intact +2 bilaterally Skin - warm, dry Neuro - awake, alert, oriented x3, normal gait Psych - Normal mood and affect, normal behavior        Assessment & Plan:   Problem List Items Addressed This Visit      Cardiovascular and Mediastinum   Intractable migraine without aura and without status migrainosus Improving on propranolol.  No headaches since last visit about 2 months ago.   Continue propranolol.  Considered LA, but will likely be too high  of dose of medication.   Relevant Medications   propranolol (INDERAL) 10 MG tablet    Other Visit Diagnoses    Palpitations    -  Primary Improving on propranolol.  Pt reports consistent palpitations when missing doses.  HR increases up to 110-120 multiple times daily on wristwatch style wearable HR tracker.  Plan: 1. Continue propranolol 5 mg tid.  Low dose required as pt not tolerating higher dose. 2. Repeat holter monitor - 48 hours.  Consider echo, stress test, or Cardiology referral depending on findings. 3. Labs: TSH, CMP for identification of possible  causes. 4. Followup 3 months or sooner if needed.    Relevant Medications   propranolol (INDERAL) 10 MG tablet   Other Relevant Orders   Holter monitor - 48 hour   TSH   COMPLETE METABOLIC PANEL WITH GFR   Social anxiety disorder     Improved on propranolol, but remains uncontrolled.  Pt is avoiding social situations including some work responsibilities because of her anxiety symptoms.  Possible component and contributing factor to palpitations.  Plan: 1. Continue propranolol. 2. START buspirone 7.5 mg bid.  Take 1 tablet once daily for 1 week. Then take 1 tab twice daily and continue.  Reviewed common side effects. 3. Followup 3 months.   Relevant Medications   propranolol (INDERAL) 10 MG tablet   busPIRone (BUSPAR) 7.5 MG tablet      Meds ordered this encounter  Medications  . propranolol (INDERAL) 10 MG tablet    Sig: Take 0.5 tablets (5 mg total) by mouth 3 (three) times daily.    Dispense:  135 tablet    Refill:  3    Order Specific Question:   Supervising Provider    Answer:   Smitty CordsKARAMALEGOS, ALEXANDER J [[id number]]  . busPIRone (BUSPAR) 7.5 MG tablet    Sig: Take 1 tablet (7.5 mg total) by mouth 3 (three) times daily.    Dispense:  60 tablet    Refill:  5    Order Specific Question:   Supervising Provider    Answer:   Smitty CordsKARAMALEGOS, ALEXANDER J [[id number]]    Follow up plan: Return in about 4 months (around 09/18/2017) for Palpitations.  Holly McardleLauren Isidoro Santillana, DNP, AGPCNP-BC Adult Gerontology Primary Care Nurse Practitioner [facility] 05/19/2017, 1:52 PM

## 2017-05-19 NOTE — Patient Instructions (Addendum)
Holly Bryant,  Thank you for coming in to clinic today.  1. Continue propranolol 5 mg (1/2 tablet), but change to three times daily.  2. Holter monitor.  [facility] will call you to schedule this.  3. START buspar 7.5 mg once daily for 1 week.  Increase to twice daily and continue.  Please schedule a follow-up appointment with Holly Bryant, AGNP. Return in about 4 months (around 09/18/2017) for Palpitations.  If you have any other questions or concerns, please feel free to call the clinic or send a message through MyChart. You may also schedule an earlier appointment if necessary.  You will receive a survey after today's visit either digitally by e-mail or paper by [employer] mail. Your experiences and feedback matter to us.  Please respond so we know how we are doing as we provide care for you.   Holly McardleLauren Sahasra Belue, DNP, AGNP-BC Adult Gerontology Nurse Practitioner [facility], [facility]

## 2017-05-21 ENCOUNTER — Other Ambulatory Visit: Payer: BLUE CROSS/BLUE SHIELD

## 2017-05-22 LAB — TSH: TSH: 4.02 mIU/L

## 2017-05-22 LAB — COMPLETE METABOLIC PANEL WITH GFR
AG Ratio: 1.9 (calc) (ref 1.0–2.5)
ALT: 42 U/L — ABNORMAL HIGH (ref 6–29)
AST: 27 U/L (ref 10–35)
Albumin: 4.5 g/dL (ref 3.6–5.1)
Alkaline phosphatase (APISO): 84 U/L (ref 33–130)
BUN: 13 mg/dL (ref 7–25)
CO2: 27 mmol/L (ref 20–32)
Calcium: 9.7 mg/dL (ref 8.6–10.4)
Chloride: 105 mmol/L (ref 98–110)
Creat: 0.75 mg/dL (ref 0.50–1.05)
GFR, Est African American: 106 mL/min/{1.73_m2} (ref >=60)
GFR, Est Non African American: 92 mL/min/{1.73_m2} (ref >=60)
Globulin: 2.4 g/dL (calc) (ref 1.9–3.7)
Glucose, Bld: 130 mg/dL — ABNORMAL HIGH (ref 65–99)
Potassium: 4.4 mmol/L (ref 3.5–5.3)
Sodium: 141 mmol/L (ref 135–146)
Total Bilirubin: 0.5 mg/dL (ref 0.2–1.2)
Total Protein: 6.9 g/dL (ref 6.1–8.1)

## 2017-05-24 ENCOUNTER — Ambulatory Visit
Admission: RE | Admit: 2017-05-24 | Discharge: 2017-05-24 | Disposition: A | Discharge status: Home / Self Care | Payer: BLUE CROSS/BLUE SHIELD | Source: Ambulatory Visit | Attending: Nurse Practitioner | Admitting: Nurse Practitioner

## 2017-05-24 DIAGNOSIS — R002 Palpitations: Secondary | ICD-10-CM | POA: Diagnosis not present

## 2017-05-28 ENCOUNTER — Ambulatory Visit
Admission: RE | Admit: 2017-05-28 | Discharge: 2017-05-28 | Disposition: A | Discharge status: Home / Self Care | Payer: BLUE CROSS/BLUE SHIELD | Source: Ambulatory Visit | Attending: Nurse Practitioner | Admitting: Nurse Practitioner

## 2017-05-28 DIAGNOSIS — R000 Tachycardia, unspecified: Secondary | ICD-10-CM | POA: Diagnosis not present

## 2017-05-28 DIAGNOSIS — R002 Palpitations: Secondary | ICD-10-CM | POA: Diagnosis present

## 2017-05-28 DIAGNOSIS — I491 Atrial premature depolarization: Secondary | ICD-10-CM | POA: Diagnosis not present

## 2017-05-28 DIAGNOSIS — I493 Ventricular premature depolarization: Secondary | ICD-10-CM | POA: Insufficient documentation

## 2017-06-03 ENCOUNTER — Encounter: Payer: Self-pay | Admitting: Nurse Practitioner

## 2017-06-06 ENCOUNTER — Other Ambulatory Visit: Payer: Self-pay | Admitting: Nurse Practitioner

## 2017-06-06 DIAGNOSIS — F4010 Social phobia, unspecified: Secondary | ICD-10-CM

## 2017-06-06 DIAGNOSIS — R002 Palpitations: Secondary | ICD-10-CM

## 2017-06-06 DIAGNOSIS — G43019 Migraine without aura, intractable, without status migrainosus: Secondary | ICD-10-CM

## 2017-06-15 ENCOUNTER — Encounter: Payer: Self-pay | Admitting: Nurse Practitioner

## 2017-06-15 NOTE — Telephone Encounter (Signed)
Results released to patient.

## 2017-06-21 ENCOUNTER — Ambulatory Visit
Admission: RE | Admit: 2017-06-21 | Discharge: 2017-06-21 | Disposition: A | Discharge status: Home / Self Care | Payer: BLUE CROSS/BLUE SHIELD | Source: Ambulatory Visit | Attending: Nurse Practitioner | Admitting: Nurse Practitioner

## 2017-06-21 DIAGNOSIS — Z1239 Encounter for other screening for malignant neoplasm of breast: Secondary | ICD-10-CM

## 2017-06-21 DIAGNOSIS — Z1231 Encounter for screening mammogram for malignant neoplasm of breast: Secondary | ICD-10-CM | POA: Diagnosis not present

## 2017-07-08 ENCOUNTER — Ambulatory Visit (INDEPENDENT_AMBULATORY_CARE_PROVIDER_SITE_OTHER): Payer: BLUE CROSS/BLUE SHIELD | Admitting: Nurse Practitioner

## 2017-07-08 ENCOUNTER — Other Ambulatory Visit: Payer: Self-pay

## 2017-07-08 ENCOUNTER — Encounter: Payer: Self-pay | Admitting: Nurse Practitioner

## 2017-07-08 VITALS — BP 142/74 | HR 62 | Temp 98.2°F | Ht 63.5 in | Wt 210.2 lb

## 2017-07-08 DIAGNOSIS — Z0000 Encounter for general adult medical examination without abnormal findings: Secondary | ICD-10-CM | POA: Diagnosis not present

## 2017-07-08 DIAGNOSIS — Z23 Encounter for immunization: Secondary | ICD-10-CM | POA: Diagnosis not present

## 2017-07-08 NOTE — Patient Instructions (Addendum)
[facility] May Helming,   Thank you for coming in to clinic today.  1. You will be due for FASTING BLOOD WORK.  This means you should eat no food or drink after midnight.  Drink only water or coffee without cream/sugar on the morning of your lab visit. - Please go ahead and schedule a "Lab Only" visit in the morning at the clinic for lab draw in the next 7 days. - Your results will be available about 2-3 days after blood draw.  If you have set up a MyChart account, you can can log in to MyChart online to view your results and a brief explanation. Also, we can discuss your results together at your next office visit if you would like.   2. Increase your physical activity until you are increasing your heart rate for 30 minutes on most days of the week.   3. Sleep Hygiene Tips  Take medicines only as directed by your health care provider.  Keep regular sleeping and waking hours. Avoid naps.  Keep a sleep diary to help you and your health care provider figure out what could be causing your insomnia. Include:  When you sleep.  When you wake up during the night.  How well you sleep.  How rested you feel the next day.  Any side effects of medicines you are taking.  What you eat and drink.  Make your bedroom a comfortable place where it is easy to fall asleep:  Put up shades or special blackout curtains to block light from outside.  Use a white noise machine to block noise.  Keep the temperature cool.  Exercise regularly as directed by your health care provider. Avoid exercising right before bedtime.  Use relaxation techniques to manage stress. Ask your health care provider to suggest some techniques that may work well for you. These may include:  Breathing exercises.  Routines to release muscle tension.  Visualizing peaceful scenes.  Cut back on alcohol, caffeinated beverages, and cigarettes, especially close to bedtime. These can disrupt your sleep.  Do not overeat or eat  spicy foods right before bedtime. This can lead to digestive discomfort that can make it hard for you to sleep.  Limit screen use before bedtime. This includes:  Watching TV.  Using your smartphone, tablet, and computer.  Stick to a routine. This can help you fall asleep faster. Try to do a quiet activity, brush your teeth, and go to bed at the same time each night.  Get out of bed if you are still awake after 15 minutes of trying to sleep. Keep the lights down, but try reading or doing a quiet activity. When you feel sleepy, go back to bed.  Make sure that you drive carefully. Avoid driving if you feel very sleepy.  Keep all follow-up appointments as directed by your health care provider. This is important.   Please schedule a follow-up appointment with [name], AGNP. Return in about 1 year (around 07/09/2018) for annual physical.  If you have any other questions or concerns, please feel free to call the clinic or send a message through MyChart. You may also schedule an earlier appointment if necessary.  You will receive a survey after today's visit either digitally by e-mail or paper by [employer]. Your experiences and feedback matter to [place].  Please respond so we know how we are doing as we provide care for you.   [name], DNP, AGNP-BC Adult Gerontology Nurse Practitioner [facility], [facility]  Compression Socks - Wear during the day when you are sitting for more than 3 hours.  Start with 18-25 mmHg up to 30 mmHg.  [employer] will measure you and fit you if you need assistance with sizing.  [facility] [address] [place], [place] [id number] [phone]

## 2017-07-08 NOTE — Progress Notes (Signed)
Subjective:    Patient ID: Holly Bryant, female    DOB: 17-Feb-1966, 52 y.o.   MRN: [id number]  Holly Bryant is a 52 y.o. female presenting on 07/08/2017 for Annual Exam   HPI Annual Physical Exam Patient has been feeling stressed for family medical concerns.  They have acute concerns today for leg swelling bilaterally with redness of lower legs after dependence. Sleeps 5-6 hours per night interrupted with being awake and alert with difficulty going back to sleep.  Wakes with some racing thoughts and mild panic.  Has never taken medications for sleep in past, but has had these periods of insomnia with worse stress.  Stopped buspirone 2/2 anger after initially taking this.  HEALTH MAINTENANCE: Weight/BMI: continuing to eat in response to stress Physical activity: very little Diet: sandwich, salad for lunch.  Eats something when at home in evening.  Binge eating at night which is worse with living situation. Seatbelt: always Sunscreen: regularly PAP: Hysterectomy - defers Mammogram: negative 06/2017 DEXA: never -  Colon Cancer Screen: 04/2017 and negative HIV: requests Hep C also no high risk sexual activity. Optometry: Yearly Dentistry: Last was about 6 years - has dental insurance  VACCINES: Tetanus: Done November 17, 2007 Shingles: desires    Past Medical History:  Diagnosis Date  . Anemia    before hysterectomy  . Anxiety   . Attention deficit disorder (ADD)   . Cardiac arrhythmia    SVT - followed by PCP  . Depression   . Nephrolithiasis   . PTSD (post-traumatic stress disorder)    Past Surgical History:  Procedure Laterality Date  . ABDOMINAL HYSTERECTOMY    . COLONOSCOPY WITH PROPOFOL N/A 04/21/2017   Procedure: COLONOSCOPY WITH PROPOFOL;  Surgeon: Lin Landsman, MD;  Location: [place];  Service: Endoscopy;  Laterality: N/A;  . TENNIS ELBOW RELEASE/NIRSCHEL PROCEDURE Right   . TUBAL LIGATION     Social History   Socioeconomic History   . Marital status: Married    Spouse name: Not on file  . Number of children: Not on file  . Years of education: Not on file  . Highest education level: Not on file  Occupational History  . Not on file  Social Needs  . Financial resource strain: Not on file  . Food insecurity:    Worry: Not on file    Inability: Not on file  . Transportation needs:    Medical: Not on file    Non-medical: Not on file  Tobacco Use  . Smoking status: Never Smoker  . Smokeless tobacco: Never Used  Substance and Sexual Activity  . Alcohol use: No  . Drug use: No  . Sexual activity: Yes  Lifestyle  . Physical activity:    Days per week: Not on file    Minutes per session: Not on file  . Stress: Not on file  Relationships  . Social connections:    Talks on phone: Not on file    Gets together: Not on file    Attends religious service: Not on file    Active member of club or organization: Not on file    Attends meetings of clubs or organizations: Not on file    Relationship status: Not on file  . Intimate partner violence:    Fear of current or ex partner: Not on file    Emotionally abused: Not on file    Physically abused: Not on file    Forced sexual activity: Not on file  Other Topics Concern  . Not on file  Social History Narrative  . Not on file   Family History  Adopted: Yes  Problem Relation Age of Onset  . Colon cancer Mother   . Heart attack Mother   . Colon cancer Brother 28  . Stroke Brother   . Chronic Renal Failure Maternal Grandmother   . Diabetes Maternal Grandmother   . Breast cancer Neg Hx    Current Outpatient Medications on File Prior to Visit  Medication Sig  . aspirin 81 MG tablet Take 81 mg by mouth daily.  . butalbital-acetaminophen-caffeine (FIORICET, ESGIC) 50-325-40 MG tablet Take 0.5-1 tablets by mouth 2 (two) times daily as needed for migraine.  . Cholecalciferol (VITAMIN D3) 10000 units TABS Take by mouth.  . Multiple Vitamin (MULTIVITAMIN) tablet Take  1 tablet by mouth daily.  Marland Kitchen omeprazole (PRILOSEC) 20 MG capsule Take 1 capsule (20 mg total) by mouth daily.  . propranolol (INDERAL) 10 MG tablet TAKE 1 TABLET BY MOUTH TWICE DAILY  . busPIRone (BUSPAR) 7.5 MG tablet Take 1 tablet (7.5 mg total) by mouth 3 (three) times daily. (Patient not taking: Reported on 07/08/2017)   No current facility-administered medications on file prior to visit.     Review of Systems  Constitutional: Negative for chills and fever.  HENT: Negative for congestion and sore throat.   Eyes: Negative for pain.  Respiratory: Negative for cough, shortness of breath and wheezing.   Cardiovascular: Positive for leg swelling. Negative for chest pain and palpitations.  Gastrointestinal: Negative for abdominal pain, blood in stool, constipation, diarrhea, nausea and vomiting.  Endocrine: Negative for polydipsia.  Genitourinary: Negative for dysuria, frequency, hematuria and urgency.  Musculoskeletal: Negative for back pain, myalgias and neck pain.  Skin: Negative.  Negative for rash.  Allergic/Immunologic: Negative for environmental allergies.  Neurological: Negative for dizziness, weakness and headaches.  Hematological: Does not bruise/bleed easily.  Psychiatric/Behavioral: Positive for sleep disturbance. Negative for dysphoric mood and suicidal ideas. The patient is nervous/anxious.    Per HPI unless specifically indicated above     Objective:    BP (!) 142/74 (BP Location: Right Arm, Patient Position: Sitting, Cuff Size: Large)   Pulse 62   Temp 98.2 F (36.8 C) (Oral)   Ht 5' 3.5" (1.613 m)   Wt 210 lb 3.2 oz (95.3 kg)   BMI 36.65 kg/m   Wt Readings from Last 3 Encounters:  07/08/17 210 lb 3.2 oz (95.3 kg)  05/19/17 208 lb 6.4 oz (94.5 kg)  04/21/17 203 lb (92.1 kg)    Physical Exam  Constitutional: She is oriented to person, place, and time. She appears well-developed and well-nourished. No distress.  HENT:  Head: Normocephalic and atraumatic.  Right  Ear: External ear normal.  Left Ear: External ear normal.  Nose: Nose normal.  Mouth/Throat: Oropharynx is clear and moist.  Eyes: Pupils are equal, round, and reactive to light. Conjunctivae are normal.  Neck: Normal range of motion. Neck supple. No JVD present. No tracheal deviation present. No thyromegaly present.  Cardiovascular: Normal rate, regular rhythm, normal heart sounds and intact distal pulses. Exam reveals no gallop and no friction rub.  No murmur heard. Pulmonary/Chest: Effort normal and breath sounds normal. No respiratory distress.  Breast - Normal exam w/ symmetric breasts, no mass, no nipple discharge, no skin changes or tenderness.   Abdominal: Soft. Bowel sounds are normal. She exhibits no distension. There is no tenderness.  Musculoskeletal: Normal range of motion.  Lymphadenopathy:  She has no cervical adenopathy.  Neurological: She is alert and oriented to person, place, and time. No cranial nerve deficit.  Skin: Skin is warm and dry.  Psychiatric: She has a normal mood and affect. Her behavior is normal. Judgment and thought content normal.  Nursing note and vitals reviewed.    Results for orders placed or performed in visit on 05/19/17  TSH  Result Value Ref Range   TSH 4.02 mIU/L  COMPLETE METABOLIC PANEL WITH GFR  Result Value Ref Range   Glucose, Bld 130 (H) 65 - 99 mg/dL   BUN 13 7 - 25 mg/dL   Creat 0.75 0.50 - 1.05 mg/dL   GFR, Est Non African American 92 > OR = 60 mL/min/1.12m   GFR, Est African American 106 > OR = 60 mL/min/1.768m  BUN/Creatinine Ratio NOT APPLICABLE 6 - 22 (calc)   Sodium 141 135 - 146 mmol/L   Potassium 4.4 3.5 - 5.3 mmol/L   Chloride 105 98 - 110 mmol/L   CO2 27 20 - 32 mmol/L   Calcium 9.7 8.6 - 10.4 mg/dL   Total Protein 6.9 6.1 - 8.1 g/dL   Albumin 4.5 3.6 - 5.1 g/dL   Globulin 2.4 1.9 - 3.7 g/dL (calc)   AG Ratio 1.9 1.0 - 2.5 (calc)   Total Bilirubin 0.5 0.2 - 1.2 mg/dL   Alkaline phosphatase (APISO) 84 33 - 130  U/L   AST 27 10 - 35 U/L   ALT 42 (H) 6 - 29 U/L      Assessment & Plan:   Problem List Items Addressed This Visit    None    Visit Diagnoses    Need for shingles vaccine    -  Primary   Relevant Orders   Varicella-zoster vaccine IM (Shingrix) (Completed)   Need for diphtheria-tetanus-pertussis (Tdap) vaccine       Relevant Orders   Tdap vaccine greater than or equal to 7yo IM (Completed)   Encounter for annual physical exam       Relevant Orders   Tdap vaccine greater than or equal to 7yo IM (Completed)   Varicella-zoster vaccine IM (Shingrix) (Completed)   COMPLETE METABOLIC PANEL WITH GFR   Hemoglobin A1c (Completed)   Hepatitis C antibody (Completed)   HIV antibody (Completed)   Lipid panel (Completed)   CBC with Differential/Platelet (Completed)   VITAMIN D 25 Hydroxy (Vit-D Deficiency, Fractures) (Completed)   TSH (Completed)   CMP14+EGFR      Physical exam with no new findings.  Well adult with acute concerns about leg swelling (follow up after labs if continues).    Plan: 1. Obtain health maintenance screenings as above according to age. - Increase physical activity to 30 minutes most days of the week.  - Eat healthy diet high in vegetables and fruits; low in refined carbohydrates. - Encouraged non-pharm stress management strategies. 2. Return 1 year for annual physical.   Follow up plan: Return in about 1 year (around 07/09/2018) for annual physical.  LaCassell SmilesDNP, AGPCNP-BC Adult Gerontology Primary Care Nurse Practitioner [facility] 07/08/2017, 4:00 PM

## 2017-07-14 LAB — CBC WITH DIFFERENTIAL/PLATELET
Basophils Absolute: 0 10*3/uL (ref 0.0–0.2)
Basos: 0 %
EOS (ABSOLUTE): 0.1 10*3/uL (ref 0.0–0.4)
Eos: 2 %
Hematocrit: 41.2 % (ref 34.0–46.6)
Hemoglobin: 13.7 g/dL (ref 11.1–15.9)
Immature Grans (Abs): 0 10*3/uL (ref 0.0–0.1)
Immature Granulocytes: 0 %
Lymphocytes Absolute: 2 10*3/uL (ref 0.7–3.1)
Lymphs: 39 %
MCH: 28.8 pg (ref 26.6–33.0)
MCHC: 33.3 g/dL (ref 31.5–35.7)
MCV: 87 fL (ref 79–97)
Monocytes Absolute: 0.3 10*3/uL (ref 0.1–0.9)
Monocytes: 5 %
Neutrophils Absolute: 2.8 10*3/uL (ref 1.4–7.0)
Neutrophils: 54 %
Platelets: 262 10*3/uL (ref 150–379)
RBC: 4.76 x10E6/uL (ref 3.77–5.28)
RDW: 13.6 % (ref 12.3–15.4)
WBC: 5.2 10*3/uL (ref 3.4–10.8)

## 2017-07-14 LAB — HEMOGLOBIN A1C
Est. average glucose Bld gHb Est-mCnc: 146 mg/dL
Hgb A1c MFr Bld: 6.7 % — ABNORMAL HIGH (ref 4.8–5.6)

## 2017-07-14 LAB — LIPID PANEL
Chol/HDL Ratio: 5 ratio — ABNORMAL HIGH (ref 0.0–4.4)
Cholesterol, Total: 201 mg/dL — ABNORMAL HIGH (ref 100–199)
HDL: 40 mg/dL (ref >39)
LDL Calculated: 126 mg/dL — ABNORMAL HIGH (ref 0–99)
Triglycerides: 177 mg/dL — ABNORMAL HIGH (ref 0–149)
VLDL Cholesterol Cal: 35 mg/dL (ref 5–40)

## 2017-07-14 LAB — HEPATITIS C ANTIBODY: Hep C Virus Ab: <0.1 s/co ratio (ref 0.0–0.9)

## 2017-07-14 LAB — HIV ANTIBODY (ROUTINE TESTING W REFLEX): HIV Screen 4th Generation wRfx: NONREACTIVE

## 2017-07-14 LAB — VITAMIN D 25 HYDROXY (VIT D DEFICIENCY, FRACTURES): Vit D, 25-Hydroxy: 25.9 ng/mL — ABNORMAL LOW (ref 30.0–100.0)

## 2017-07-14 LAB — TSH: TSH: 3.74 u[IU]/mL (ref 0.450–4.500)

## 2017-07-15 ENCOUNTER — Telehealth: Payer: Self-pay

## 2017-07-15 MED ORDER — METFORMIN HCL 500 MG PO TABS: 500.0000 mg | ORAL_TABLET | Freq: Every day | ORAL | 6 refills | Status: DC

## 2017-07-15 NOTE — Telephone Encounter (Signed)
Hgb A1c: Your A1c is your average blood sugar over 3 months. Your current value tells me you have a new diagnosis of diabetes. This is easily managed and kept in control with medications. Even without medications right now, your A1c is at our treatment goal for someone who has diabetes. To prevent worsening, please work to improve diet and increase physical activity. I also would like to start metformin. Please take metformin 500 mg once daily with breakfast. We will meet again in clinic to discuss this further. Please make an appointment within the next 2-3 weeks. There are other things to discuss. For now, start with metformin and we will add the other treatment goals at your visit.

## 2017-07-19 LAB — COMPREHENSIVE METABOLIC PANEL
ALT: 37 IU/L — ABNORMAL HIGH (ref 0–32)
AST: 23 IU/L (ref 0–40)
Albumin/Globulin Ratio: 1.8 (ref 1.2–2.2)
Albumin: 4.2 g/dL (ref 3.5–5.5)
Alkaline Phosphatase: 86 IU/L (ref 39–117)
BUN/Creatinine Ratio: 15 (ref 9–23)
BUN: 11 mg/dL (ref 6–24)
Bilirubin Total: 0.3 mg/dL (ref 0.0–1.2)
CO2: 23 mmol/L (ref 20–29)
Calcium: 9.2 mg/dL (ref 8.7–10.2)
Chloride: 103 mmol/L (ref 96–106)
Creatinine, Ser: 0.73 mg/dL (ref 0.57–1.00)
GFR calc Af Amer: 110 mL/min/{1.73_m2} (ref >59)
GFR calc non Af Amer: 95 mL/min/{1.73_m2} (ref >59)
Globulin, Total: 2.4 g/dL (ref 1.5–4.5)
Glucose: 142 mg/dL — ABNORMAL HIGH (ref 65–99)
Potassium: 4.4 mmol/L (ref 3.5–5.2)
Sodium: 143 mmol/L (ref 134–144)
Total Protein: 6.6 g/dL (ref 6.0–8.5)

## 2017-07-19 LAB — SPECIMEN STATUS REPORT

## 2017-08-02 ENCOUNTER — Encounter: Payer: Self-pay | Admitting: Nurse Practitioner

## 2017-08-02 ENCOUNTER — Ambulatory Visit (INDEPENDENT_AMBULATORY_CARE_PROVIDER_SITE_OTHER): Payer: BLUE CROSS/BLUE SHIELD | Admitting: Nurse Practitioner

## 2017-08-02 ENCOUNTER — Other Ambulatory Visit: Payer: Self-pay

## 2017-08-02 VITALS — BP 124/64 | HR 66 | Temp 97.8°F | Ht 63.5 in | Wt 203.8 lb

## 2017-08-02 DIAGNOSIS — Z79899 Other long term (current) drug therapy: Secondary | ICD-10-CM

## 2017-08-02 DIAGNOSIS — G43019 Migraine without aura, intractable, without status migrainosus: Secondary | ICD-10-CM

## 2017-08-02 DIAGNOSIS — F4010 Social phobia, unspecified: Secondary | ICD-10-CM

## 2017-08-02 DIAGNOSIS — E119 Type 2 diabetes mellitus without complications: Secondary | ICD-10-CM

## 2017-08-02 DIAGNOSIS — R002 Palpitations: Secondary | ICD-10-CM

## 2017-08-02 MED ORDER — PROPRANOLOL HCL 10 MG PO TABS: 5.0000 mg | ORAL_TABLET | Freq: Three times a day (TID) | ORAL | 2 refills | Status: DC

## 2017-08-02 MED ORDER — ATORVASTATIN CALCIUM 40 MG PO TABS: 40.0000 mg | ORAL_TABLET | Freq: Every day | ORAL | 3 refills | Status: DC

## 2017-08-02 MED ORDER — LISINOPRIL 2.5 MG PO TABS: 2.5000 mg | ORAL_TABLET | Freq: Every day | ORAL | 1 refills | Status: DC

## 2017-08-02 NOTE — Patient Instructions (Addendum)
[facility] May [place],   Thank you for coming in to clinic today.  1. Your provider would like to you have your annual eye exam. Please contact your current eye doctor or here are some good options for you to contact.   [facility]   Address: [address] [place], [place] [address] Phone: [phone]  Website: [email or web address]   [facility] [address], [place], [place] [address] Phone: [phone] [email or web address]  [facility]  Address: [address] [place], [place], [place] [address] Phone: [phone]   [facility] [address] [place], [place] [address] Phone: [phone]  [facility] Address: [address] [place], [place], [place] [address]  Phone: [phone]   Please schedule a follow-up appointment with Holly Bryant, AGNP. Return in about 3 months (around 11/02/2017) for diabetes.  If you have any other questions or concerns, please feel free to call the clinic or send a message through MyChart. You may also schedule an earlier appointment if necessary.  You will receive a survey after today's visit either digitally by e-mail or paper by [employer]. Your experiences and feedback matter to [place].  Please respond so we know how we are doing as we provide care for you.   Holly Mcardle, DNP, AGNP-BC Adult Gerontology Nurse Practitioner [facility]

## 2017-08-02 NOTE — Progress Notes (Signed)
Subjective:    Patient ID: Holly Bryant, female    DOB: [birth year], 52 y.o.   MRN: [id number]  Holly Bryant is a 52 y.o. female presenting on 08/02/2017 for Diabetes   HPI Diabetes Pt presents today for follow up of Type 2 diabetes mellitus. She is not checking CBG at home - Current diabetic medications include: metformin - She is not currently symptomatic.  - She denies polydipsia, polyphagia, polyuria, headaches, diaphoresis, shakiness, chills, pain, numbness or tingling in extremities and changes in vision.   - Clinical course has been stable. - She  reports no regular exercise routine. - Her diet is moderate in salt, moderate in fat, and moderate in carbohydrates.  Significantly lower carb, diarrhea has  - Weight trend: stable  PREVENTION: Eye exam current (within one year): no Foot exam current (within one year): no Lipid/ASCVD risk reduction - on statin: yes Kidney protection - on ace or arb: yes Recent Labs    07/13/17 0757  HGBA1C 6.7*   Migraines Improved with addition of propranolol.  Continues to use Fioricet when she gets migraines with moderate relief.  Palpitations Patient reports very few palpitations.  Only notices when she is been late taking her second daily doses of propranolol  Social Anxiety disorder Patient continues to report some anxiety when in public spaces or with large groups of people.  It is moderately improved on propranolol.   Social History   Tobacco Use  . Smoking status: Never Smoker  . Smokeless tobacco: Never Used  Substance Use Topics  . Alcohol use: No  . Drug use: No    Review of Systems Per HPI unless specifically indicated above     Objective:    BP 124/64 (BP Location: Right Arm, Patient Position: Sitting, Cuff Size: Normal)   Pulse 66   Temp 97.8 F (36.6 C) (Oral)   Ht 5' 3.5" (1.613 m)   Wt 203 lb 12.8 oz (92.4 kg)   BMI 35.54 kg/m   Wt Readings from Last 3 Encounters:  08/02/17 203 lb 12.8 oz  (92.4 kg)  07/08/17 210 lb 3.2 oz (95.3 kg)  05/19/17 208 lb 6.4 oz (94.5 kg)    Physical Exam  Constitutional: She is oriented to person, place, and time. She appears well-developed and well-nourished. No distress.  HENT:  Head: Normocephalic and atraumatic.  Neck: Neck supple. No JVD present.  Cardiovascular: Normal rate, regular rhythm, S1 normal, S2 normal, normal heart sounds and intact distal pulses.  Pulmonary/Chest: Effort normal and breath sounds normal. No respiratory distress.  Musculoskeletal: She exhibits no edema.  Lymphadenopathy:    She has no cervical adenopathy.  Neurological: She is alert and oriented to person, place, and time.  Skin: Skin is warm and dry. Capillary refill takes less than 2 seconds.  Psychiatric: She has a normal mood and affect. Her behavior is normal. Judgment and thought content normal.  Vitals reviewed.   Diabetic Foot Exam - Simple   Simple Foot Form Diabetic Foot exam was performed with the following findings:  Yes 08/02/2017  2:40 PM  Visual Inspection No deformities, no ulcerations, no other skin breakdown bilaterally:  Yes Sensation Testing Intact to touch and monofilament testing bilaterally:  Yes Pulse Check Posterior Tibialis and Dorsalis pulse intact bilaterally:  Yes Comments     Results for orders placed or performed in visit on 07/08/17  Hemoglobin A1c  Result Value Ref Range   Hgb A1c MFr Bld 6.7 (H) 4.8 - 5.6 %  Est. average glucose Bld gHb Est-mCnc 146 mg/dL  Hepatitis C antibody  Result Value Ref Range   Hep C Virus Ab <0.1 0.0 - 0.9 s/co ratio  HIV antibody  Result Value Ref Range   HIV Screen 4th Generation wRfx Non Reactive Non Reactive  Lipid panel  Result Value Ref Range   Cholesterol, Total 201 (H) 100 - 199 mg/dL   Triglycerides 161 (H) 0 - 149 mg/dL   HDL 40 >09 mg/dL   VLDL Cholesterol Cal 35 5 - 40 mg/dL   LDL Calculated 604 (H) 0 - 99 mg/dL   Chol/HDL Ratio 5.0 (H) 0.0 - 4.4 ratio  CBC with  Differential/Platelet  Result Value Ref Range   WBC 5.2 3.4 - 10.8 x10E3/uL   RBC 4.76 3.77 - 5.28 x10E6/uL   Hemoglobin 13.7 11.1 - 15.9 g/dL   Hematocrit 54.0 98.1 - 46.6 %   MCV 87 79 - 97 fL   MCH 28.8 26.6 - 33.0 pg   MCHC 33.3 31.5 - 35.7 g/dL   RDW 19.1 47.8 - 29.5 %   Platelets 262 150 - 379 x10E3/uL   Neutrophils 54 Not Estab. %   Lymphs 39 Not Estab. %   Monocytes 5 Not Estab. %   Eos 2 Not Estab. %   Basos 0 Not Estab. %   Neutrophils Absolute 2.8 1.4 - 7.0 x10E3/uL   Lymphocytes Absolute 2.0 0.7 - 3.1 x10E3/uL   Monocytes Absolute 0.3 0.1 - 0.9 x10E3/uL   EOS (ABSOLUTE) 0.1 0.0 - 0.4 x10E3/uL   Basophils Absolute 0.0 0.0 - 0.2 x10E3/uL   Immature Granulocytes 0 Not Estab. %   Immature Grans (Abs) 0.0 0.0 - 0.1 x10E3/uL  VITAMIN D 25 Hydroxy (Vit-D Deficiency, Fractures)  Result Value Ref Range   Vit D, 25-Hydroxy 25.9 (L) 30.0 - 100.0 ng/mL  TSH  Result Value Ref Range   TSH 3.740 0.450 - 4.500 uIU/mL  Comprehensive metabolic panel  Result Value Ref Range   Glucose 142 (H) 65 - 99 mg/dL   BUN 11 6 - 24 mg/dL   Creatinine, Ser 6.21 0.57 - 1.00 mg/dL   GFR calc non Af Amer 95 >59 mL/min/1.73   GFR calc Af Amer 110 >59 mL/min/1.73   BUN/Creatinine Ratio 15 9 - 23   Sodium 143 134 - 144 mmol/L   Potassium 4.4 3.5 - 5.2 mmol/L   Chloride 103 96 - 106 mmol/L   CO2 23 20 - 29 mmol/L   Calcium 9.2 8.7 - 10.2 mg/dL   Total Protein 6.6 6.0 - 8.5 g/dL   Albumin 4.2 3.5 - 5.5 g/dL   Globulin, Total 2.4 1.5 - 4.5 g/dL   Albumin/Globulin Ratio 1.8 1.2 - 2.2   Bilirubin Total 0.3 0.0 - 1.2 mg/dL   Alkaline Phosphatase 86 39 - 117 IU/L   AST 23 0 - 40 IU/L   ALT 37 (H) 0 - 32 IU/L  Specimen status report  Result Value Ref Range   specimen status report Comment       Assessment & Plan:   Problem List Items Addressed This Visit      Cardiovascular and Mediastinum   Intractable migraine without aura and without status migrainosus   Relevant Medications    atorvastatin (LIPITOR) 40 MG tablet   lisinopril (ZESTRIL) 2.5 MG tablet   propranolol (INDERAL) 10 MG tablet    Other Visit Diagnoses    High risk medication use      Relevant Orders   Comprehensive  Metabolic Panel (CMET)   Comprehensive metabolic panel   Type 2 diabetes mellitus without complication, without long-term current use of insulin (HCC)      -  Primary ControlledDM with A1c  < 7.0%. - No known complications or hypoglycemia.  Plan:  1. Continue current therapy: metformin 500 mg daily w/ breakfast  2. Encourage improved lifestyle: - low carb/low glycemic diet reinforced prior education - Increase physical activity to 30 minutes most days of the week.  Explained that increased physical activity increases body's use of sugar for energy. 3.  4. Continue ACEi and Statin 5. DM Foot exam done today with normal findings.   and Advised to schedule DM ophtho exam, send record. 6. Follow-up 3 months    Relevant Medications   atorvastatin (LIPITOR) 40 MG tablet   lisinopril (ZESTRIL) 2.5 MG tablet   Other Relevant Orders   Comprehensive Metabolic Panel (CMET)   Comprehensive metabolic panel   Palpitations     Stable and controlled with propranolol 10 mg tablet 1/2 tab tid.  Patient reports no sustained palpitations/SVT as has been experienced in past.   Relevant Medications   propranolol (INDERAL) 10 MG tablet   Social anxiety disorder     The current medical regimen is effective;  continue present plan and medications. Refill provided.   Relevant Medications   propranolol (INDERAL) 10 MG tablet      Meds ordered this encounter  Medications  . atorvastatin (LIPITOR) 40 MG tablet    Sig: Take 1 tablet (40 mg total) by mouth daily.    Dispense:  90 tablet    Refill:  3    Order Specific Question:   Supervising Provider    Answer:   Smitty Cords [[id number]]  . lisinopril (ZESTRIL) 2.5 MG tablet    Sig: Take 1 tablet (2.5 mg total) by mouth daily.    Dispense:  90  tablet    Refill:  1    Order Specific Question:   Supervising Provider    Answer:   Smitty Cords [[id number]]  . propranolol (INDERAL) 10 MG tablet    Sig: Take 0.5 tablets (5 mg total) by mouth 3 (three) times daily.    Dispense:  135 tablet    Refill:  2    **Patient requests 90 days supply**    Order Specific Question:   Supervising Provider    Answer:   Smitty Cords [[id number]]    Follow up plan: Return in about 3 months (around 11/02/2017) for diabetes.  [name], DNP, AGPCNP-BC Adult Gerontology Primary Care Nurse Practitioner [facility] 08/02/2017, 3:12 PM

## 2017-08-04 ENCOUNTER — Other Ambulatory Visit: Payer: Self-pay | Admitting: Nurse Practitioner

## 2017-08-05 LAB — COMPREHENSIVE METABOLIC PANEL
ALT: 70 IU/L — ABNORMAL HIGH (ref 0–32)
AST: 40 IU/L (ref 0–40)
Albumin/Globulin Ratio: 2.1 (ref 1.2–2.2)
Albumin: 4.9 g/dL (ref 3.5–5.5)
Alkaline Phosphatase: 92 IU/L (ref 39–117)
BUN/Creatinine Ratio: 17 (ref 9–23)
BUN: 13 mg/dL (ref 6–24)
Bilirubin Total: 0.4 mg/dL (ref 0.0–1.2)
CO2: 23 mmol/L (ref 20–29)
Calcium: 9.8 mg/dL (ref 8.7–10.2)
Chloride: 105 mmol/L (ref 96–106)
Creatinine, Ser: 0.75 mg/dL (ref 0.57–1.00)
GFR calc Af Amer: 105 mL/min/{1.73_m2} (ref >59)
GFR calc non Af Amer: 91 mL/min/{1.73_m2} (ref >59)
Globulin, Total: 2.3 g/dL (ref 1.5–4.5)
Glucose: 126 mg/dL — ABNORMAL HIGH (ref 65–99)
Potassium: 4.6 mmol/L (ref 3.5–5.2)
Sodium: 144 mmol/L (ref 134–144)
Total Protein: 7.2 g/dL (ref 6.0–8.5)

## 2017-08-07 ENCOUNTER — Encounter: Payer: Self-pay | Admitting: Nurse Practitioner

## 2017-08-10 ENCOUNTER — Encounter: Payer: Self-pay | Admitting: Nurse Practitioner

## 2017-09-12 ENCOUNTER — Encounter: Payer: Self-pay | Admitting: Nurse Practitioner

## 2017-09-20 ENCOUNTER — Other Ambulatory Visit: Payer: Self-pay

## 2017-09-20 ENCOUNTER — Ambulatory Visit (INDEPENDENT_AMBULATORY_CARE_PROVIDER_SITE_OTHER): Payer: BLUE CROSS/BLUE SHIELD | Admitting: Nurse Practitioner

## 2017-09-20 ENCOUNTER — Encounter: Payer: Self-pay | Admitting: Nurse Practitioner

## 2017-09-20 VITALS — BP 117/53 | HR 61 | Temp 98.5°F | Ht 63.5 in | Wt 201.2 lb

## 2017-09-20 DIAGNOSIS — R251 Tremor, unspecified: Secondary | ICD-10-CM

## 2017-09-20 DIAGNOSIS — E119 Type 2 diabetes mellitus without complications: Secondary | ICD-10-CM

## 2017-09-20 DIAGNOSIS — K219 Gastro-esophageal reflux disease without esophagitis: Secondary | ICD-10-CM

## 2017-09-20 DIAGNOSIS — R002 Palpitations: Secondary | ICD-10-CM | POA: Diagnosis not present

## 2017-09-20 DIAGNOSIS — G43019 Migraine without aura, intractable, without status migrainosus: Secondary | ICD-10-CM

## 2017-09-20 DIAGNOSIS — R2689 Other abnormalities of gait and mobility: Secondary | ICD-10-CM

## 2017-09-20 DIAGNOSIS — Z23 Encounter for immunization: Secondary | ICD-10-CM

## 2017-09-20 MED ORDER — OMEPRAZOLE 20 MG PO CPDR: 20.0000 mg | DELAYED_RELEASE_CAPSULE | ORAL | 2 refills | Status: DC

## 2017-09-20 MED ORDER — ATORVASTATIN CALCIUM 20 MG PO TABS: 20.0000 mg | ORAL_TABLET | ORAL | 1 refills | Status: DC

## 2017-09-20 NOTE — Progress Notes (Signed)
Subjective:    Patient ID: Holly Bryant, female    DOB: 1965/09/17, 52 y.o.   MRN: [id number]  Holly Bryant is a 52 y.o. female presenting on 09/20/2017 for heart palpation (heart flutters, pt states now she notice the flutters more if she forget to take her medication. )   HPI Palpitations Patient continues to experience palpitations.  Has been on propranolol 1/2 tab tid for last 7 months approx.  Had used atenolol in past, but we changed to propranolol for migraine prophylaxis benefit. - Holter Monitor 05/2017 revealed no significant SVT or other causes of palpitations that were prolonged.  Headaches Migraines continue to be very well controlled and rarely occur.  Patient has had increase in standard headache to daily with statin dose daily.  They lessened in intensity with dose reduction from atorvastatin 40 mg to 20 mg daily.    Dizziness Wavering - never happens with palpitations.  No actually falling, but notes this is happening without stimulus.  Can lose footing/balance, but no other symptoms. No weakness, pain in legs or feet.  Joints are painful.  "Muscle fatigue"  Is trying to correlate symptoms, but no specific stimulus.  Twitchy muscles.  Sits all day.  Takes stairs.    Tremor Patient continues to have concerns about her tremor.  Tremor is still happening with action for hands, specifically with clenched R fist.  Patient also notes resting tremor in lower extremities.  This occurs and is most bothersome when patient is at work.  Occurs when feet are resting flat on floor, extended to a foot rest and when fully elevated.  Foot tremors will happen at rest, hand tremors occur only with action. - Adopted: no known family history about parkinson's disease 15 years ago - neurology consult with/ dx benign essential tremors.  Did PT, B12 injections and improved, but only for short term.  GERD Patient has had complete resolution of symptoms of acid reflux since January start  of omeprazole.  She has also had slow, steady weight loss of 9 lbs since 07/08/2017 (approx 2.5 months).  This is done with reducing snacking/choosing healthier snacks and avoiding eating immediately before bed.  She has also worked on reducing portion sizes.  She gets full quickly when eating meals out at restaurants compared to past.  T2DM AM CBG 130-140.  If eating later at night or "not good foods" like sweets/brownies, AM CBG rises to near 180.   - Current diabetic medications include: metformin 500 mg daily in am - She is not currently symptomatic.  - She denies polydipsia, polyphagia, polyuria, headaches, diaphoresis, shakiness, chills, pain, numbness or tingling in extremities and changes in vision.   - Clinical course has been improving. - She  reports an exercise routine that includes walking, gym, 3 days per week. - Her diet is moderate in salt, moderate in fat, and moderate in carbohydrates. - Weight trend: decreasing steadily  PREVENTION: Eye exam current (within one year): no - appointment scheduled September [facility] Foot exam current (within one year): yes Lipid/ASCVD risk reduction - on statin: yes - see above for headaches Kidney protection - on ace or arb: yes - lisinopril 2.5 mg once daily Recent Labs    07/13/17 0757  HGBA1C 6.7*    Social History   Tobacco Use  . Smoking status: Never Smoker  . Smokeless tobacco: Never Used  Substance Use Topics  . Alcohol use: No  . Drug use: No    Review  of Systems Per HPI unless specifically indicated above     Objective:    BP (!) 117/53 (BP Location: Left Arm, Patient Position: Sitting, Cuff Size: Large)   Pulse 61   Temp 98.5 F (36.9 C) (Oral)   Ht 5' 3.5" (1.613 m)   Wt 201 lb 3.2 oz (91.3 kg)   BMI 35.08 kg/m   Wt Readings from Last 3 Encounters:  09/20/17 201 lb 3.2 oz (91.3 kg)  08/02/17 203 lb 12.8 oz (92.4 kg)  07/08/17 210 lb 3.2 oz (95.3 kg)    Physical Exam  Constitutional: She  is oriented to person, place, and time. She appears well-developed and well-nourished. No distress.  HENT:  Head: Normocephalic and atraumatic.  Cardiovascular: Normal rate, regular rhythm, S1 normal, S2 normal, normal heart sounds and intact distal pulses.  No extrasystoles are present. Exam reveals no gallop, no S3 and no S4.  No murmur heard. Pulmonary/Chest: Effort normal and breath sounds normal. No respiratory distress.  Neurological: She is alert and oriented to person, place, and time. She has normal strength. No cranial nerve deficit or sensory deficit. She displays a negative Romberg sign. Coordination and gait normal. GCS eye subscore is 4. GCS verbal subscore is 5. GCS motor subscore is 6.  Reflex Scores:      Bicep reflexes are 2+ on the right side and 2+ on the left side.      Patellar reflexes are 2+ on the right side and 1+ on the left side. Single leg balance on R leg weaker than Left.  Skin: Skin is warm and dry. Capillary refill takes less than 2 seconds.  Psychiatric: She has a normal mood and affect. Her behavior is normal. Judgment and thought content normal.  Vitals reviewed.   Results for orders placed or performed in visit on 08/04/17  Comprehensive metabolic panel  Result Value Ref Range   Glucose 126 (H) 65 - 99 mg/dL   BUN 13 6 - 24 mg/dL   Creatinine, Ser 1.610.75 0.57 - 1.00 mg/dL   GFR calc non Af Amer 91 >59 mL/min/1.73   GFR calc Af Amer 105 >59 mL/min/1.73   BUN/Creatinine Ratio 17 9 - 23   Sodium 144 134 - 144 mmol/L   Potassium 4.6 3.5 - 5.2 mmol/L   Chloride 105 96 - 106 mmol/L   CO2 23 20 - 29 mmol/L   Calcium 9.8 8.7 - 10.2 mg/dL   Total Protein 7.2 6.0 - 8.5 g/dL   Albumin 4.9 3.5 - 5.5 g/dL   Globulin, Total 2.3 1.5 - 4.5 g/dL   Albumin/Globulin Ratio 2.1 1.2 - 2.2   Bilirubin Total 0.4 0.0 - 1.2 mg/dL   Alkaline Phosphatase 92 39 - 117 IU/L   AST 40 0 - 40 IU/L   ALT 70 (H) 0 - 32 IU/L      Assessment & Plan:   Problem List Items  Addressed This Visit      Cardiovascular and Mediastinum   Intractable migraine without aura and without status migrainosus    Controlled and stable on propranolol.  Daily tension headache noted with daily atorvastatin.  Will reduce atorvastatin to 20 mg once weekly.  If tolerated, increase to twice weekly.  If twice weekly is tolerated, take MWF.  Patient verbalizes understanding.      Relevant Medications   atorvastatin (LIPITOR) 20 MG tablet     Digestive   Gastroesophageal reflux disease    Stable and now resolved on omeprazole.  Patient also  with diet/lifestyle changes resulting in weight loss.  Trial off PPI.  Taper down with 1 tab every other day x 14 days.  Then, stop.  Can use Pepcid 1 tab bid prn for heartburn  Followup with [facility] if symptoms recur on daily basis.      Relevant Medications   omeprazole (PRILOSEC) 20 MG capsule     Endocrine   Type 2 diabetes mellitus without complication, without long-term current use of insulin (HCC)    Improving on metformin 500 mg in am.  Home CBG corresponds without improved control and A1c remaining at goal.  Too early to recheck.   - ace - tolerating well - statin - increased tension headaches (see plan migraines above)  Plan: 1. Continue metformin and lisinopril 2. Continue work to improve diet and increase exercise. 3. Ophthalmology appointment is scheduled for September 2019. 4. Continue checking am fasting CBG 5. Reduce statin to once weekly.  Then, increase to twice weekly if tolerated.  Then, increase to three times daily if tolerated and continue to next appointment. 6. Labs around 10/12/2017 for A1c. 7. Followup 4 months or sooner if needed.      Relevant Medications   atorvastatin (LIPITOR) 20 MG tablet   Other Relevant Orders   Hemoglobin A1c    Other Visit Diagnoses    Tremor, unspecified    -  Primary   Relevant Orders   Ambulatory referral to Neurology   Balance problems       Relevant Orders   Ambulatory  referral to Neurology   Palpitations       Relevant Orders   Ambulatory referral to Cardiology   Need for shingles vaccine       Relevant Orders   Varicella-zoster vaccine IM (Shingrix) (Completed)     # Tremor, balance problems  Patient with recurrence of active hand tremors and resting foot tremors.   Concern for possible early Parkinson's disease vs other cause of resting tremor and near falling with R leg weakness.  Neuro exam unremarkable except for mildly decreased R patellar reflex and weakness. Normal gait at this time.   - Referral neurology evaluation of tremor and leg weakness with near falling/tripping.   - Consider Physical therapy as in past for improvement of weakness - patient currently declines.  # Palpitations Mildly worsening over last 3 months.  Prior holter monitor unremarkable.  Patient previously unable to tolerate higher doses beta blocker.   - Continue propranolol 15 mg tid po.  Set alarm for reminder to take all doses.   - Referral cardiology for further evaluation of palpitations since is worsening.  # Shingles Vaccine Patient has received #1 dose of Shingrix and presents today for #2 dose of 2 total required doses.  Administer.  Reviewed side effects and cautioned patient she may again feel like she has a bad cold as her body mounts immune response.  Followup as needed.    Meds ordered this encounter  Medications  . atorvastatin (LIPITOR) 20 MG tablet    Sig: Take 1 tablet (20 mg total) by mouth once a week. Patient to increase dose to twice weekly and three times weekly as tolerated.    Dispense:  30 tablet    Refill:  1    Order Specific Question:   Supervising Provider    Answer:   Smitty Cords [[id number]]  . omeprazole (PRILOSEC) 20 MG capsule    Sig: Take 1 capsule (20 mg total) by mouth every other day for 14  days.    Dispense:  30 capsule    Refill:  2    Order Specific Question:   Supervising Provider    Answer:   Smitty Cords  [[id number]]    Follow up plan: Return in about 4 months (around 01/21/2018) for diabetes.   A total of 40 minutes was spent face-to-face with this patient. Greater than 50% of this time (30/40 minutes) was spent in counseling and coordination of care with the patient.   [name], DNP, AGPCNP-BC Adult Gerontology Primary Care Nurse Practitioner [facility] 09/20/2017, 4:28 PM

## 2017-09-20 NOTE — Assessment & Plan Note (Signed)
Controlled and stable on propranolol.  Daily tension headache noted with daily atorvastatin.  Will reduce atorvastatin to 20 mg once weekly.  If tolerated, increase to twice weekly.  If twice weekly is tolerated, take MWF.  Patient verbalizes understanding.

## 2017-09-20 NOTE — Assessment & Plan Note (Signed)
Stable and now resolved on omeprazole.  Patient also with diet/lifestyle changes resulting in weight loss.  Trial off PPI.  Taper down with 1 tab every other day x 14 days.  Then, stop.  Can use Pepcid 1 tab bid prn for heartburn  [facility] if symptoms recur on daily basis.

## 2017-09-20 NOTE — Patient Instructions (Addendum)
[facility],   Thank you for coming in to clinic today.  1. Referrals to Neurology [facility] and Cardiology [facility]) Both office will call to schedule your appointment.  2. Continue propranolol without change for now.  Set phone alarms for reminders to take your propranolol.  3. Atorvastatin - Take 1/2 pill once weekly, then increase to MWF as tolerated without headaches/side effects.  4. Omeprazole - Take 1 pill every other day for 14 days then stop.  You may not need this long term with your weight loss.  If needed after you stop, take Pepcid up to twice daily as needed for heartburn.  If daily symptoms, we will resume omeprazole.  Contact [facility].  Please schedule a follow-up appointment with Holly Bryant, AGNP. Return in about 4 months (around 01/21/2018) for diabetes.  If you have any other questions or concerns, please feel free to call the clinic or send a message through MyChart. You may also schedule an earlier appointment if necessary.  You will receive a survey after today's visit either digitally by e-mail or paper by [employer] mail. Your experiences and feedback matter to us.  Please respond so we know how we are doing as we provide care for you.   Holly McardleLauren Mikaele Stecher, DNP, AGNP-BC Adult Gerontology Nurse Practitioner [facility]

## 2017-09-20 NOTE — Assessment & Plan Note (Signed)
Improving on metformin 500 mg in am.  Home CBG corresponds without improved control and A1c remaining at goal.  Too early to recheck.   - ace - tolerating well - statin - increased tension headaches (see plan migraines above)  Plan: 1. Continue metformin and lisinopril 2. Continue work to improve diet and increase exercise. 3. Ophthalmology appointment is scheduled for September 2019. 4. Continue checking am fasting CBG 5. Reduce statin to once weekly.  Then, increase to twice weekly if tolerated.  Then, increase to three times daily if tolerated and continue to next appointment. 6. Labs around 10/12/2017 for A1c. 7. Followup 4 months or sooner if needed.

## 2017-10-11 ENCOUNTER — Encounter: Payer: Self-pay | Admitting: Nurse Practitioner

## 2017-10-11 DIAGNOSIS — E119 Type 2 diabetes mellitus without complications: Secondary | ICD-10-CM

## 2017-10-11 NOTE — Addendum Note (Signed)
Addended by: Wilhelmina McardleKENNEDY, Derk Doubek R on: 10/11/2017 12:04 PM   Modules accepted: Orders

## 2017-10-11 NOTE — Telephone Encounter (Signed)
Mychart message

## 2017-10-12 NOTE — Telephone Encounter (Signed)
Please fax orders to [facility]

## 2017-10-19 LAB — COMPREHENSIVE METABOLIC PANEL
ALT: 41 IU/L — ABNORMAL HIGH (ref 0–32)
AST: 26 IU/L (ref 0–40)
Albumin/Globulin Ratio: 2 (ref 1.2–2.2)
Albumin: 4.3 g/dL (ref 3.5–5.5)
Alkaline Phosphatase: 91 IU/L (ref 39–117)
BUN/Creatinine Ratio: 15 (ref 9–23)
BUN: 11 mg/dL (ref 6–24)
Bilirubin Total: 0.2 mg/dL (ref 0.0–1.2)
CO2: 25 mmol/L (ref 20–29)
Calcium: 9.1 mg/dL (ref 8.7–10.2)
Chloride: 104 mmol/L (ref 96–106)
Creatinine, Ser: 0.74 mg/dL (ref 0.57–1.00)
GFR calc Af Amer: 107 mL/min/{1.73_m2} (ref >59)
GFR calc non Af Amer: 93 mL/min/{1.73_m2} (ref >59)
Globulin, Total: 2.1 g/dL (ref 1.5–4.5)
Glucose: 118 mg/dL — ABNORMAL HIGH (ref 65–99)
Potassium: 4.2 mmol/L (ref 3.5–5.2)
Sodium: 143 mmol/L (ref 134–144)
Total Protein: 6.4 g/dL (ref 6.0–8.5)

## 2017-10-19 LAB — LIPID PANEL
Chol/HDL Ratio: 3.8 ratio (ref 0.0–4.4)
Cholesterol, Total: 135 mg/dL (ref 100–199)
HDL: 36 mg/dL — ABNORMAL LOW (ref >39)
LDL Calculated: 80 mg/dL (ref 0–99)
Triglycerides: 96 mg/dL (ref 0–149)
VLDL Cholesterol Cal: 19 mg/dL (ref 5–40)

## 2017-10-19 LAB — HEMOGLOBIN A1C
Est. average glucose Bld gHb Est-mCnc: 131 mg/dL
Hgb A1c MFr Bld: 6.2 % — ABNORMAL HIGH (ref 4.8–5.6)

## 2017-10-25 ENCOUNTER — Encounter: Payer: Self-pay | Admitting: Nurse Practitioner

## 2017-10-25 DIAGNOSIS — E119 Type 2 diabetes mellitus without complications: Secondary | ICD-10-CM

## 2017-10-25 MED ORDER — METFORMIN HCL ER 500 MG PO TB24: 500.0000 mg | ORAL_TABLET | Freq: Every day | ORAL | 3 refills | Status: DC

## 2017-11-18 LAB — HM DIABETES EYE EXAM

## 2017-11-22 ENCOUNTER — Ambulatory Visit: Payer: BLUE CROSS/BLUE SHIELD | Admitting: Cardiovascular Disease

## 2017-11-22 ENCOUNTER — Encounter: Payer: Self-pay | Admitting: Cardiovascular Disease

## 2017-11-22 ENCOUNTER — Encounter

## 2017-11-22 ENCOUNTER — Telehealth: Payer: Self-pay | Admitting: Cardiovascular Disease

## 2017-11-22 VITALS — BP 130/80 | HR 62 | Ht 63.5 in | Wt 196.5 lb

## 2017-11-22 DIAGNOSIS — I479 Paroxysmal tachycardia, unspecified: Secondary | ICD-10-CM | POA: Diagnosis not present

## 2017-11-22 DIAGNOSIS — E119 Type 2 diabetes mellitus without complications: Secondary | ICD-10-CM

## 2017-11-22 DIAGNOSIS — R079 Chest pain, unspecified: Secondary | ICD-10-CM | POA: Diagnosis not present

## 2017-11-22 DIAGNOSIS — E785 Hyperlipidemia, unspecified: Secondary | ICD-10-CM | POA: Insufficient documentation

## 2017-11-22 DIAGNOSIS — E782 Mixed hyperlipidemia: Secondary | ICD-10-CM | POA: Diagnosis not present

## 2017-11-22 DIAGNOSIS — R002 Palpitations: Secondary | ICD-10-CM | POA: Insufficient documentation

## 2017-11-22 DIAGNOSIS — I471 Supraventricular tachycardia: Secondary | ICD-10-CM

## 2017-11-22 NOTE — Patient Instructions (Signed)
Medication Instructions:   No medication changes made  At a later date, We could consider starting a different b-blocker Bystolic, 24 hour pill with propranolol as needed Or bisoprolol can be once or twice a day  Labwork:  No new labs needed  Testing/Procedures:  We will order an echocardiogram for chest pain, palpitations  After echo, will will place a ZIO patch, 2 week  for tachycardia, palpitations   Follow-Up: It was a pleasure seeing you in the office today. Please call us if you have new issues that need to be addressed before your next appt.  [phone]  Your physician wants you to follow-up in:    If you need a refill on your cardiac medications before your next appointment, please call your pharmacy.  For educational health videos Log in to : [email or web address] Or : [email or web address], password : [email or web address]

## 2017-11-22 NOTE — Progress Notes (Signed)
Cardiology Office Note  Date:  11/22/2017   ID:  Barkley Boards Dayona, Shaheen June 25, 1965, MRN [id number]  PCP:  Galen Manila, NP   Chief Complaint  Patient presents with  . other    Ref by [name], NP for palpitations & follow up from the 48 hour holter monitor. Pt. c/o right side weakness, right leg gives out, chest pain, shortness of breath with little exertion, palpitations very frequently and has fluttering in chest at times.  Meds reviewed by the pt. verbally.     HPI:   Tremor in her hands, seen by neurology Ataxia Headaches Dizziness GERD Type 2 diabetes Referred by lauren  Kyung Rudd for symptoms of palpitations  She has been taking propranolol one half pill 3 times a day for 7 months Prior to that used atenolol Changed to propranolol for migraine prophylaxis benefit For propranolol recently increased up to 15 mg 3 times a day Stayed at 10 mg secondary to fatigue  2 week monitor 3 yr ago Tach sometimes when sleeping Full results not available  Holter monitor March 2019 Rare APCs and PVCs, Rare sinus tach  Deconditioned, Sits all day No regular exercise program Works at [employer]  Total chol 135 Hemoglobin A1c 6.2  EKG personally reviewed by myself on todays visit  shows normal sinus rhythm With rate 62 bpm no significant ST or T-wave changes   PMH:   has a past medical history of Anemia, Anxiety, Attention deficit disorder (ADD), Cardiac arrhythmia, Depression, Nephrolithiasis, and PTSD (post-traumatic stress disorder).  PSH:    Past Surgical History:  Procedure Laterality Date  . ABDOMINAL HYSTERECTOMY    . COLONOSCOPY WITH PROPOFOL N/A 04/21/2017   Procedure: COLONOSCOPY WITH PROPOFOL;  Surgeon: Toney Reil, MD;  [facility];  Service: Endoscopy;  Laterality: N/A;  . TENNIS ELBOW RELEASE/NIRSCHEL PROCEDURE Right   . TUBAL LIGATION      Current Outpatient Medications  Medication Sig Dispense Refill  . aspirin 81 MG  tablet Take 81 mg by mouth daily.    Marland Kitchen atorvastatin (LIPITOR) 20 MG tablet Take 1 tablet (20 mg total) by mouth once a week. Patient to increase dose to twice weekly and three times weekly as tolerated. 30 tablet 1  . butalbital-acetaminophen-caffeine (FIORICET, ESGIC) 50-325-40 MG tablet Take 0.5-1 tablets by mouth 2 (two) times daily as needed for migraine. 14 tablet 0  . Cholecalciferol (VITAMIN D3) 10000 units TABS Take by mouth.    Marland Kitchen lisinopril (ZESTRIL) 2.5 MG tablet Take 1 tablet (2.5 mg total) by mouth daily. 90 tablet 1  . metFORMIN (GLUCOPHAGE XR) 500 MG 24 hr tablet Take 1 tablet (500 mg total) by mouth daily with breakfast. 30 tablet 3  . Multiple Vitamin (MULTIVITAMIN) tablet Take 1 tablet by mouth daily.    Marland Kitchen omeprazole (PRILOSEC) 20 MG capsule Take 1 capsule (20 mg total) by mouth every other day for 14 days. 30 capsule 2  . propranolol (INDERAL) 10 MG tablet Take 0.5 tablets (5 mg total) by mouth 3 (three) times daily. 135 tablet 2   No current facility-administered medications for this visit.      Allergies:   Codeine; Morphine and related; Oxycodone; Oxycontin [oxycodone hcl]; and Sumatriptan   Social History:  The patient  reports that she has never smoked. She has never used smokeless tobacco. She reports that she does not drink alcohol or use drugs.   Family History:   family history includes Chronic Renal Failure in her maternal grandmother; Colon  cancer in her mother; Colon cancer (age of onset: 42) in her brother; Diabetes in her maternal grandmother; Heart attack in her mother; Stroke in her brother. She was adopted.    Review of Systems: Review of Systems  Constitutional: Negative.   Respiratory: Negative.   Cardiovascular: Positive for palpitations.       Tachycardia, occasional chest tightness  Gastrointestinal: Negative.   Musculoskeletal: Negative.   Neurological: Negative.   Psychiatric/Behavioral: Negative.   All other systems reviewed and are  negative.    PHYSICAL EXAM: VS:  BP 130/80 (BP Location: Right Arm, Patient Position: Sitting, Cuff Size: Normal)   Pulse 62   Ht 5' 3.5" (1.613 m)   Wt 196 lb 8 oz (89.1 kg)   BMI 34.26 kg/m  , BMI Body mass index is 34.26 kg/m. GEN: Well nourished, well developed, in no acute distress  HEENT: normal  Neck: no JVD, carotid bruits, or masses Cardiac: RRR; no murmurs, rubs, or gallops,no edema  Respiratory:  clear to auscultation bilaterally, normal work of breathing GI: soft, nontender, nondistended, + BS MS: no deformity or atrophy  Skin: warm and dry, no rash Neuro:  Strength and sensation are intact Psych: euthymic mood, full affect   Recent Labs: 07/13/2017: Hemoglobin 13.7; Platelets 262; TSH 3.740 10/18/2017: ALT 41; BUN 11; Creatinine, Ser 0.74; Potassium 4.2; Sodium 143    Lipid Panel Lab Results  Component Value Date   CHOL 135 10/18/2017   HDL 36 (L) 10/18/2017   LDLCALC 80 10/18/2017   TRIG 96 10/18/2017      Wt Readings from Last 3 Encounters:  11/22/17 196 lb 8 oz (89.1 kg)  09/20/17 201 lb 3.2 oz (91.3 kg)  08/02/17 203 lb 12.8 oz (92.4 kg)      ASSESSMENT AND PLAN:  Paroxysmal tachycardia (HCC) We have ordered 2 week event monitor, zio patch For now will continue on propranolol Discussed other options for beta blockers including bisoprolol or bystolic unable to advance the beta blocker secondary tofatigue  Palpitations -  APCs and PVCs seen on previous Holter Longer-term monitor as above, 2 weeks Likely will require different beta blocker regiment  Chest pain, unspecified type - Plan: ECHOCARDIOGRAM COMPLETE Atypical in nature ECHO card a gram ordered at her request to rule out structural heart disease  Mixed hyperlipidemia Cholesterol is at goal on the current lipid regimen. No changes to the medications were made.  Type 2 diabetes We have encouraged continued exercise, careful diet management in an effort to lose  weight.   Disposition:   F/U  As needed We will call her with the results of the tests above   Total encounter time more than 60 minutes  Greater than 50% was spent in counseling and coordination of care with the patient  Patient was seen in consultation for [name] and will be referred back to her office for ongoing care of the issues detailed above    Orders Placed This Encounter  Procedures  . LONG TERM MONITOR (3-14 DAYS)  . EKG 12-Lead  . ECHOCARDIOGRAM COMPLETE     Signed, Dossie Arbour, M.D., Ph.D. 11/22/2017  [facility] [place], [place] [phone]

## 2017-11-22 NOTE — Telephone Encounter (Signed)
Recieved request from : Patient for intermittent fmla   Scanned into ROI request   Forwarded [employer] to  [employer] for processing with Signed ROI

## 2017-11-23 ENCOUNTER — Other Ambulatory Visit: Payer: Self-pay | Admitting: Neurology

## 2017-11-23 DIAGNOSIS — R270 Ataxia, unspecified: Secondary | ICD-10-CM

## 2017-11-29 ENCOUNTER — Ambulatory Visit (INDEPENDENT_AMBULATORY_CARE_PROVIDER_SITE_OTHER): Payer: BLUE CROSS/BLUE SHIELD

## 2017-11-29 ENCOUNTER — Other Ambulatory Visit: Payer: Self-pay

## 2017-11-29 DIAGNOSIS — R079 Chest pain, unspecified: Secondary | ICD-10-CM | POA: Diagnosis not present

## 2017-11-29 DIAGNOSIS — R002 Palpitations: Secondary | ICD-10-CM | POA: Diagnosis not present

## 2017-12-01 NOTE — Telephone Encounter (Signed)
Placed in nurse box for md review

## 2017-12-02 NOTE — Telephone Encounter (Signed)
FMLA forms received and placed on provider desk (Dr. Mariah MillingGollan) for review and signature.

## 2017-12-05 ENCOUNTER — Encounter: Payer: Self-pay | Admitting: Nurse Practitioner

## 2017-12-06 ENCOUNTER — Telehealth: Payer: Self-pay | Admitting: *Deleted

## 2017-12-06 NOTE — Telephone Encounter (Signed)
See results note. Reviewed results with patient and she verbalized understanding with no further questions at this time.

## 2017-12-06 NOTE — Telephone Encounter (Signed)
Left voicemail message to call back for her echocardiogram results.  

## 2017-12-06 NOTE — Telephone Encounter (Signed)
-----   Message from Antonieta Ibaimothy J Gollan, MD sent at 12/05/2017 12:02 PM EDT ----- Echocardiogram Normal LV function No significant valve disease

## 2017-12-07 NOTE — Telephone Encounter (Signed)
Mychart message

## 2017-12-09 ENCOUNTER — Encounter: Payer: Self-pay | Admitting: Nurse Practitioner

## 2017-12-09 DIAGNOSIS — Z8659 Personal history of other mental and behavioral disorders: Secondary | ICD-10-CM

## 2017-12-10 MED ORDER — ALPRAZOLAM 0.5 MG PO TABS: ORAL_TABLET | ORAL | 0 refills | Status: DC

## 2017-12-14 ENCOUNTER — Ambulatory Visit
Admission: RE | Admit: 2017-12-14 | Discharge: 2017-12-14 | Disposition: A | Discharge status: Home / Self Care | Payer: BLUE CROSS/BLUE SHIELD | Source: Ambulatory Visit | Attending: Neurology | Admitting: Neurology

## 2017-12-14 DIAGNOSIS — R270 Ataxia, unspecified: Secondary | ICD-10-CM | POA: Diagnosis not present

## 2017-12-14 DIAGNOSIS — G319 Degenerative disease of nervous system, unspecified: Secondary | ICD-10-CM | POA: Insufficient documentation

## 2017-12-14 DIAGNOSIS — R531 Weakness: Secondary | ICD-10-CM | POA: Insufficient documentation

## 2017-12-16 NOTE — Telephone Encounter (Signed)
Forms completed and placed on Sabrina's desk for processing. 

## 2017-12-17 NOTE — Telephone Encounter (Signed)
comleted forms sent to [facility] via inter office mail

## 2017-12-26 ENCOUNTER — Other Ambulatory Visit: Payer: Self-pay | Admitting: Nurse Practitioner

## 2017-12-26 DIAGNOSIS — E119 Type 2 diabetes mellitus without complications: Secondary | ICD-10-CM

## 2018-01-03 ENCOUNTER — Telehealth: Payer: Self-pay | Admitting: *Deleted

## 2018-01-03 NOTE — Telephone Encounter (Signed)
-----   Message from Antonieta Iba, MD sent at 01/01/2018  2:35 PM EDT ----- Monitor Normal sinus rhythm Avg HR of 70 bpm.  2 Supraventricular Tachycardia runs occurred,  the longest lasting 8 beats with an avg rate of 141 bpm.   Isolated SVEs were rare (<1.0%), SVE Couplets were rare (<1.0%), and SVE Triplets were rare (<1.0%). Isolated VEs were rare (<1.0%), and no VE Couplets or VE Triplets were present.  Patient triggered events predominantly ectopy/APCs  Not a tremendous amount of arrhythmia ---could stay on her propranolol three times a day or as needed Or try a different B-blocker such as metoprolol succinate 25 daily, or bystolic 10 mg daily

## 2018-01-03 NOTE — Telephone Encounter (Signed)
Left voicemail message for patient to call back to review results.

## 2018-01-03 NOTE — Telephone Encounter (Signed)
Patient returning call.

## 2018-01-03 NOTE — Telephone Encounter (Signed)
Spoke with patient and reviewed results and recommendations. She verbalized understanding and states that currently she is on 1/2 tablet of propranolol three times daily due to it causing fatigue. She prefers to stay on propranolol for now and will call us back if her symptoms persist or worsen. She was appreciative for the call back with no further questions at this time.

## 2018-01-08 ENCOUNTER — Other Ambulatory Visit: Payer: Self-pay | Admitting: Nurse Practitioner

## 2018-01-08 DIAGNOSIS — E119 Type 2 diabetes mellitus without complications: Secondary | ICD-10-CM

## 2018-01-10 ENCOUNTER — Other Ambulatory Visit: Payer: Self-pay | Admitting: *Deleted

## 2018-01-10 MED ORDER — METOPROLOL SUCCINATE ER 25 MG PO TB24: 25.0000 mg | ORAL_TABLET | Freq: Every day | ORAL | 6 refills | Status: DC

## 2018-01-23 ENCOUNTER — Other Ambulatory Visit: Payer: Self-pay | Admitting: Nurse Practitioner

## 2018-01-23 DIAGNOSIS — E119 Type 2 diabetes mellitus without complications: Secondary | ICD-10-CM

## 2018-01-24 ENCOUNTER — Other Ambulatory Visit: Payer: Self-pay | Admitting: Nurse Practitioner

## 2018-01-24 DIAGNOSIS — E119 Type 2 diabetes mellitus without complications: Secondary | ICD-10-CM

## 2018-01-25 ENCOUNTER — Ambulatory Visit (INDEPENDENT_AMBULATORY_CARE_PROVIDER_SITE_OTHER): Payer: BLUE CROSS/BLUE SHIELD | Admitting: Nurse Practitioner

## 2018-01-25 ENCOUNTER — Other Ambulatory Visit: Payer: Self-pay

## 2018-01-25 ENCOUNTER — Encounter: Payer: Self-pay | Admitting: Nurse Practitioner

## 2018-01-25 VITALS — BP 137/70 | HR 58 | Temp 98.1°F | Ht 63.5 in | Wt 205.4 lb

## 2018-01-25 DIAGNOSIS — E119 Type 2 diabetes mellitus without complications: Secondary | ICD-10-CM

## 2018-01-25 DIAGNOSIS — I10 Essential (primary) hypertension: Secondary | ICD-10-CM | POA: Diagnosis not present

## 2018-01-25 LAB — POCT GLYCOSYLATED HEMOGLOBIN (HGB A1C): Hemoglobin A1C: 6.3 % — AB (ref 4.0–5.6)

## 2018-01-25 MED ORDER — LISINOPRIL 10 MG PO TABS: 10.0000 mg | ORAL_TABLET | Freq: Every day | ORAL | 1 refills | Status: DC

## 2018-01-25 MED ORDER — METOPROLOL SUCCINATE ER 50 MG PO TB24: 50.0000 mg | ORAL_TABLET | Freq: Every day | ORAL | 5 refills | Status: DC

## 2018-01-25 MED ORDER — METOPROLOL SUCCINATE ER 25 MG PO TB24: 25.0000 mg | ORAL_TABLET | Freq: Every day | ORAL | 1 refills | Status: DC
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
## 2018-01-25 NOTE — Patient Instructions (Addendum)
Holly Bryant "Char",   Thank you for coming in to clinic today.  1. INCREASE lisinopril to 10 mg once daily.   - BP goal is < 130/80. - If you have dizziness, balance difficulty more than usual, or any significant fatigue for longer than 2-4 weeks, [name].  2. Continue metformin XL 500 mg in morning with breakfast.  Please schedule a follow-up appointment with [name], AGNP. Return in about 3 months (around 04/27/2018) for hypertension, diabetes.  If you have any other questions or concerns, please feel free to call the clinic or send a message through MyChart. You may also schedule an earlier appointment if necessary.  You will receive a survey after today's visit either digitally by e-mail or paper by [employer] mail. Your experiences and feedback matter to us.  Please respond so we know how we are doing as we provide care for you.   Wilhelmina McardleLauren Kawena Lyday, DNP, AGNP-BC Adult Gerontology Nurse Practitioner [facility], [id number]

## 2018-02-09 ENCOUNTER — Encounter: Payer: Self-pay | Admitting: Nurse Practitioner

## 2018-02-09 DIAGNOSIS — R251 Tremor, unspecified: Secondary | ICD-10-CM

## 2018-02-09 DIAGNOSIS — R2689 Other abnormalities of gait and mobility: Secondary | ICD-10-CM

## 2018-02-15 NOTE — Addendum Note (Signed)
Addended by: Vernard GamblesKENNEDY, Glenn Gullickson R on: 02/15/2018 04:08 PM   Modules accepted: Orders

## 2018-03-02 ENCOUNTER — Ambulatory Visit: Payer: BLUE CROSS/BLUE SHIELD | Attending: Neurology

## 2018-03-06 ENCOUNTER — Encounter: Payer: Self-pay | Admitting: Nurse Practitioner

## 2018-03-07 ENCOUNTER — Ambulatory Visit: Payer: BLUE CROSS/BLUE SHIELD

## 2018-03-10 ENCOUNTER — Ambulatory Visit: Payer: BLUE CROSS/BLUE SHIELD

## 2018-04-25 ENCOUNTER — Other Ambulatory Visit: Payer: Self-pay

## 2018-04-25 ENCOUNTER — Other Ambulatory Visit: Payer: Self-pay | Admitting: Nurse Practitioner

## 2018-04-25 ENCOUNTER — Encounter: Payer: Self-pay | Admitting: Nurse Practitioner

## 2018-04-25 ENCOUNTER — Ambulatory Visit (INDEPENDENT_AMBULATORY_CARE_PROVIDER_SITE_OTHER): Payer: Managed Care, Other (non HMO) | Admitting: Nurse Practitioner

## 2018-04-25 VITALS — BP 129/58 | HR 55 | Temp 98.8°F | Ht 63.5 in | Wt 198.6 lb

## 2018-04-25 DIAGNOSIS — I10 Essential (primary) hypertension: Secondary | ICD-10-CM | POA: Diagnosis not present

## 2018-04-25 DIAGNOSIS — R002 Palpitations: Secondary | ICD-10-CM

## 2018-04-25 DIAGNOSIS — G43019 Migraine without aura, intractable, without status migrainosus: Secondary | ICD-10-CM | POA: Diagnosis not present

## 2018-04-25 DIAGNOSIS — F4010 Social phobia, unspecified: Secondary | ICD-10-CM

## 2018-04-25 DIAGNOSIS — E119 Type 2 diabetes mellitus without complications: Secondary | ICD-10-CM

## 2018-04-25 DIAGNOSIS — K219 Gastro-esophageal reflux disease without esophagitis: Secondary | ICD-10-CM | POA: Diagnosis not present

## 2018-04-25 LAB — POCT GLYCOSYLATED HEMOGLOBIN (HGB A1C): Hemoglobin A1C: 6.4 % — AB (ref 4.0–5.6)

## 2018-04-25 MED ORDER — METFORMIN HCL ER 500 MG PO TB24: ORAL_TABLET | ORAL | 1 refills | Status: DC

## 2018-04-25 MED ORDER — LISINOPRIL 10 MG PO TABS: 10.0000 mg | ORAL_TABLET | Freq: Every day | ORAL | 1 refills | Status: DC

## 2018-04-25 MED ORDER — OMEPRAZOLE 20 MG PO CPDR: 20.0000 mg | DELAYED_RELEASE_CAPSULE | ORAL | 1 refills | Status: AC

## 2018-04-25 MED ORDER — ATORVASTATIN CALCIUM 40 MG PO TABS: 40.0000 mg | ORAL_TABLET | Freq: Every day | ORAL | 1 refills | Status: DC

## 2018-04-25 MED ORDER — BUTALBITAL-APAP-CAFFEINE 50-325-40 MG PO TABS: 0.5000 | ORAL_TABLET | Freq: Two times a day (BID) | ORAL | 2 refills | Status: AC | PRN

## 2018-04-25 MED ORDER — METOPROLOL SUCCINATE ER 25 MG PO TB24: 25.0000 mg | ORAL_TABLET | Freq: Every day | ORAL | 1 refills | Status: DC

## 2018-04-25 NOTE — Patient Instructions (Addendum)
Holly Bryant,   Thank you for coming in to clinic today.  1. Continue all medications without changes.   - Great work with your weight loss of 7 lbs over the last 3 months!  Please schedule a follow-up appointment with [name], AGNP. Return in about 3 months (around 07/24/2018) for diabetes, hypertension.  If you have any other questions or concerns, please feel free to call the clinic or send a message through MyChart. You may also schedule an earlier appointment if necessary.  You will receive a survey after today's visit either digitally by e-mail or paper by [employer]. Your experiences and feedback matter to [place].  Please respond so we know how we are doing as we provide care for you.   [name], DNP, AGNP-BC Adult Gerontology Nurse Practitioner [facility], [facility]

## 2018-04-25 NOTE — Progress Notes (Signed)
Subjective:    Patient ID: Holly Bryant, female    DOB: May 09, 1965, 53 y.o.   MRN: [id number]  Holly Bryant is a 53 y.o. female presenting on 04/25/2018 for Diabetes and Hypertension   HPI Diabetes Pt presents today for follow up of Type 2 diabetes mellitus. She is not checking CBG at home - Current diabetic medications include: metformin - She is not currently symptomatic.  - She denies polydipsia, polyphagia, polyuria, headaches, diaphoresis, shakiness, chills, pain, numbness or tingling in extremities and changes in vision.   - Clinical course has been stable. - She  reports no regular exercise routine. - Her diet is moderate in salt, moderate in fat, and moderate in carbohydrates.  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  Meals are having fewer carbs in general. - Weight trend: decreasing steadily  PREVENTION: Eye exam current (within one year): yes Foot exam current (within one year): yes  Lipid/ASCVD risk reduction - on statin: yes Kidney protection - on ace or arb: yes Recent Labs    07/13/17 0757 10/18/17 0714 01/25/18 0820 04/25/18 0820  HGBA1C 6.7* 6.2* 6.3* 6.4*    Hypertension/Tachycardia - She is checking BP at home or outside of clinic.  Readings 120s/70s generally  - She reports no palpitations/tachycardia regularly.  Controlled with medication - Current medications: lisinopril 10 mg once daily, metoprolol 24 hr 25 mg once daily, tolerating well without side effects - She is not currently symptomatic. - Pt denies headache, lightheadedness, dizziness, changes in vision, chest tightness/pressure, palpitations, leg swelling, sudden loss of speech or loss of consciousness.  Migraine Has had migraines with stress, Fioricet continues to provide relief when taking 1/2 tablet.  Patient falls alseep.  PATIENT takes 1/4 dose (cuts into 1/4 doses).  Leg weakness, twitching Continues having twitches, gait problems.  Previously  [facility] neurology eval.  [facility] neurology 2/19 for 2nd opinion.  Patient has since discontinued gabapentin 2/2 lethargy.  Social History   Tobacco Use  . Smoking status: Never Smoker  . Smokeless tobacco: Never Used  Substance Use Topics  . Alcohol use: No  . Drug use: No    Review of Systems Per HPI unless specifically indicated above     Objective:    BP (!) 129/58 (BP Location: Left Arm, Patient Position: Sitting, Cuff Size: Normal)   Pulse (!) 55   Temp 98.8 F (37.1 C) (Oral)   Ht 5' 3.5" (1.613 m)   Wt 198 lb 9.6 oz (90.1 kg)   BMI 34.63 kg/m   Wt Readings from Last 3 Encounters:  04/25/18 198 lb 9.6 oz (90.1 kg)  01/25/18 205 lb 6.4 oz (93.2 kg)  11/22/17 196 lb 8 oz (89.1 kg)    Physical Exam Vitals signs reviewed.  Constitutional:      General: She is awake. She is not in acute distress.    Appearance: She is well-developed.  HENT:     Head: Normocephalic and atraumatic.     Mouth/Throat:     Mouth: Mucous membranes are moist.     Pharynx: Oropharynx is clear.  Neck:     Musculoskeletal: Normal range of motion and neck supple.     Vascular: No carotid bruit.  Cardiovascular:     Rate and Rhythm: Normal rate and regular rhythm.     Pulses:          Radial pulses are 2+ on the right side and 2+ on the left side.  Posterior tibial pulses are 1+ on the right side and 1+ on the left side.     Heart sounds: Normal heart sounds, S1 normal and S2 normal.  Pulmonary:     Effort: Pulmonary effort is normal. No respiratory distress.     Breath sounds: Normal breath sounds and air entry.  Skin:    General: Skin is warm and dry.  Neurological:     Mental Status: She is alert and oriented to person, place, and time.  Psychiatric:        Attention and Perception: Attention normal.        Mood and Affect: Mood and affect normal.        Behavior: Behavior normal. Behavior is cooperative.        Thought Content: Thought content normal.        Judgment: Judgment  normal.     Results for orders placed or performed in visit on 01/25/18  POCT glycosylated hemoglobin (Hb A1C)  Result Value Ref Range   Hemoglobin A1C 6.3 (A) 4.0 - 5.6 %   HbA1c POC (<> result, manual entry)     HbA1c, POC (prediabetic range)     HbA1c, POC (controlled diabetic range)        Assessment & Plan:   Problem List Items Addressed This Visit      Cardiovascular and Mediastinum   Intractable migraine without aura and without status migrainosus Stable today on exam.  Medications tolerated without side effects.  Continue at current doses.  Refills provided.  Continue tracking days, may benefit in future with preventative if needed. Followup 6 months.    Relevant Medications   atorvastatin (LIPITOR) 40 MG tablet   butalbital-acetaminophen-caffeine (FIORICET, ESGIC) 50-325-40 MG tablet   lisinopril (PRINIVIL,ZESTRIL) 10 MG tablet   metoprolol succinate (TOPROL-XL) 25 MG 24 hr tablet     Digestive   Gastroesophageal reflux disease Currently well controlled on omeprazole 20 mg once daily.  Plan: 1. Continue omeprazole 20 mg once daily. Side effects discussed. Pt wants to continue med. 2. Avoid diet triggers. Reviewed need to seek care if globus sensation, difficulty swallowing, s/sx of GI bleed. 3. Follow up as needed and in 6 months.    Relevant Medications   omeprazole (PRILOSEC) 20 MG capsule   Other Relevant Orders   CBC with Differential/Platelet (Completed)     Endocrine   Type 2 diabetes mellitus without complication, without long-term current use of insulin (HCC) - Primary Well-controlledDM with A1c 6.4% stable from 3 months ago and goal A1c < 7.0%. - No known complications or hypoglycemia.  Plan:  1. Continue current therapy: metformin XR 500 mg once daily at breakfast.  Consider stopping in future if with weight loss A1c < 6.0 2. Encourage improved lifestyle: - low carb/low glycemic diet reinforced prior education - Increase physical activity to 30  minutes most days of the week.  Explained that increased physical activity increases body's use of sugar for energy. 3. Check fasting am CBG and bring log to next visit for review 4. Continue ASA, ACEi and Statin 5. Recommend PPSV23 vaccine - pt declines today  6. Follow-up 3 months    Relevant Medications   atorvastatin (LIPITOR) 40 MG tablet   lisinopril (PRINIVIL,ZESTRIL) 10 MG tablet   metFORMIN (GLUCOPHAGE-XR) 500 MG 24 hr tablet   Other Relevant Orders   POCT glycosylated hemoglobin (Hb A1C) (Completed)   Lipid panel (Completed)   Hemoglobin A1c (Completed)   Comprehensive metabolic panel (Completed)    Other  Visit Diagnoses    Essential hypertension     Controlled hypertension.  BP goal < 130/80.  Pt is working on lifestyle modifications.  Taking medications tolerating well without side effects. No current complications.  Plan: 1. Continue taking medications without changes 2. Obtain labs today at [facility]  3. Encouraged heart healthy diet and increasing exercise to 30 minutes most days of the week. 4. Check BP 1-2 x per week at home, keep log, and bring to clinic at next appointment. 5. Follow up 3 months.     Relevant Medications   atorvastatin (LIPITOR) 40 MG tablet   lisinopril (PRINIVIL,ZESTRIL) 10 MG tablet   metoprolol succinate (TOPROL-XL) 25 MG 24 hr tablet   Other Relevant Orders   Comprehensive metabolic panel (Completed)      Meds ordered this encounter  Medications  . atorvastatin (LIPITOR) 40 MG tablet    Sig: Take 1 tablet (40 mg total) by mouth daily at 6 PM.    Dispense:  90 tablet    Refill:  1    **Patient requests 90 days supply**    Order Specific Question:   Supervising Provider    Answer:   Smitty Cords [[id number]]  . butalbital-acetaminophen-caffeine (FIORICET, ESGIC) 50-325-40 MG tablet    Sig: Take 0.5-1 tablets by mouth 2 (two) times daily as needed for migraine.    Dispense:  14 tablet    Refill:  2    Order Specific Question:    Supervising Provider    Answer:   Smitty Cords [[id number]]  . lisinopril (PRINIVIL,ZESTRIL) 10 MG tablet    Sig: Take 1 tablet (10 mg total) by mouth daily.    Dispense:  90 tablet    Refill:  1    Order Specific Question:   Supervising Provider    Answer:   Smitty Cords [[id number]]  . metoprolol succinate (TOPROL-XL) 25 MG 24 hr tablet    Sig: Take 1 tablet (25 mg total) by mouth daily.    Dispense:  90 tablet    Refill:  1    Cancel 50 mg prescription.    Order Specific Question:   Supervising Provider    Answer:   Smitty Cords [[id number]]  . metFORMIN (GLUCOPHAGE-XR) 500 MG 24 hr tablet    Sig: TAKE 1 TABLET(500 MG) BY MOUTH DAILY WITH BREAKFAST    Dispense:  90 tablet    Refill:  1    **Patient requests 90 days supply**    Order Specific Question:   Supervising Provider    Answer:   Smitty Cords [[id number]]  . omeprazole (PRILOSEC) 20 MG capsule    Sig: Take 1 capsule (20 mg total) by mouth every other day.    Dispense:  90 capsule    Refill:  1    Order Specific Question:   Supervising Provider    Answer:   Smitty Cords [[id number]]    Follow up plan: Return in about 3 months (around 07/24/2018) for diabetes, hypertension.  [name], DNP, AGPCNP-BC Adult Gerontology Primary Care Nurse Practitioner [facility] 04/25/2018, 8:20 AM

## 2018-04-26 LAB — CBC WITH DIFFERENTIAL/PLATELET
Basophils Absolute: 0 10*3/uL (ref 0.0–0.2)
Basos: 1 %
EOS (ABSOLUTE): 0 10*3/uL (ref 0.0–0.4)
Eos: 1 %
Hematocrit: 43.1 % (ref 34.0–46.6)
Hemoglobin: 14 g/dL (ref 11.1–15.9)
Immature Grans (Abs): 0 10*3/uL (ref 0.0–0.1)
Immature Granulocytes: 0 %
Lymphocytes Absolute: 2.2 10*3/uL (ref 0.7–3.1)
Lymphs: 36 %
MCH: 28.7 pg (ref 26.6–33.0)
MCHC: 32.5 g/dL (ref 31.5–35.7)
MCV: 88 fL (ref 79–97)
Monocytes Absolute: 0.3 10*3/uL (ref 0.1–0.9)
Monocytes: 5 %
Neutrophils Absolute: 3.6 10*3/uL (ref 1.4–7.0)
Neutrophils: 57 %
Platelets: 355 10*3/uL (ref 150–450)
RBC: 4.88 x10E6/uL (ref 3.77–5.28)
RDW: 13.8 % (ref 11.7–15.4)
WBC: 6.2 10*3/uL (ref 3.4–10.8)

## 2018-04-26 LAB — COMPREHENSIVE METABOLIC PANEL
ALT: 35 IU/L — ABNORMAL HIGH (ref 0–32)
AST: 21 IU/L (ref 0–40)
Albumin/Globulin Ratio: 2.1 (ref 1.2–2.2)
Albumin: 4.9 g/dL (ref 3.8–4.9)
Alkaline Phosphatase: 82 IU/L (ref 39–117)
BUN/Creatinine Ratio: 13 (ref 9–23)
BUN: 11 mg/dL (ref 6–24)
Bilirubin Total: 0.6 mg/dL (ref 0.0–1.2)
CO2: 24 mmol/L (ref 20–29)
Calcium: 10.1 mg/dL (ref 8.7–10.2)
Chloride: 100 mmol/L (ref 96–106)
Creatinine, Ser: 0.84 mg/dL (ref 0.57–1.00)
GFR calc Af Amer: 92 mL/min/{1.73_m2} (ref >59)
GFR calc non Af Amer: 80 mL/min/{1.73_m2} (ref >59)
Globulin, Total: 2.3 g/dL (ref 1.5–4.5)
Glucose: 108 mg/dL — ABNORMAL HIGH (ref 65–99)
Potassium: 4.9 mmol/L (ref 3.5–5.2)
Sodium: 140 mmol/L (ref 134–144)
Total Protein: 7.2 g/dL (ref 6.0–8.5)

## 2018-04-26 LAB — LIPID PANEL
Chol/HDL Ratio: 4.3 ratio (ref 0.0–4.4)
Cholesterol, Total: 206 mg/dL — ABNORMAL HIGH (ref 100–199)
HDL: 48 mg/dL (ref >39)
LDL Calculated: 120 mg/dL — ABNORMAL HIGH (ref 0–99)
Triglycerides: 192 mg/dL — ABNORMAL HIGH (ref 0–149)
VLDL Cholesterol Cal: 38 mg/dL (ref 5–40)

## 2018-04-26 LAB — HEMOGLOBIN A1C
Est. average glucose Bld gHb Est-mCnc: 140 mg/dL
Hgb A1c MFr Bld: 6.5 % — ABNORMAL HIGH (ref 4.8–5.6)

## 2018-05-01 ENCOUNTER — Encounter: Payer: Self-pay | Admitting: Nurse Practitioner

## 2018-05-01 DIAGNOSIS — K047 Periapical abscess without sinus: Secondary | ICD-10-CM

## 2018-05-02 MED ORDER — AMOXICILLIN 500 MG PO TABS: 500.0000 mg | ORAL_TABLET | Freq: Two times a day (BID) | ORAL | 0 refills | Status: AC

## 2018-06-06 ENCOUNTER — Encounter: Payer: Self-pay | Admitting: Nurse Practitioner

## 2018-07-26 ENCOUNTER — Ambulatory Visit: Payer: Managed Care, Other (non HMO) | Admitting: Family Medicine

## 2018-07-26 ENCOUNTER — Ambulatory Visit: Payer: Managed Care, Other (non HMO) | Admitting: Nurse Practitioner

## 2018-08-10 ENCOUNTER — Encounter: Payer: Self-pay | Admitting: Family Medicine

## 2018-08-10 ENCOUNTER — Other Ambulatory Visit: Payer: Self-pay

## 2018-08-10 ENCOUNTER — Ambulatory Visit (INDEPENDENT_AMBULATORY_CARE_PROVIDER_SITE_OTHER): Payer: Managed Care, Other (non HMO) | Admitting: Family Medicine

## 2018-08-10 DIAGNOSIS — I10 Essential (primary) hypertension: Secondary | ICD-10-CM | POA: Diagnosis not present

## 2018-08-10 DIAGNOSIS — N951 Menopausal and female climacteric states: Secondary | ICD-10-CM | POA: Diagnosis not present

## 2018-08-10 DIAGNOSIS — E119 Type 2 diabetes mellitus without complications: Secondary | ICD-10-CM

## 2018-08-10 NOTE — Progress Notes (Signed)
Subjective:    Patient ID: Holly Bryant, female    DOB: April 09, 1965, 53 y.o.   MRN: [id number]  Holly Bryant is a 53 y.o. female presenting on 08/10/2018 for Hypertension  Virtual / Telehealth Encounter - Video Visit via D[employer] The purpose of this virtual visit is to provide medical care while limiting exposure to the novel coronavirus (COVID19) for both patient and office staff.  Consent was obtained for remote visit:  Yes.   Answered questions that patient had about telehealth interaction:  Yes.   I discussed the limitations, risks, security and privacy concerns of performing an evaluation and management service by video/telephone. I also discussed with the patient that there may be a patient responsible charge related to this service. The patient expressed understanding and agreed to proceed.  Patient Location: Home Provider Location: [facility] (Office)  PCP is [name], AGPCNP-BC - I am currently covering during her maternity leave.   HPI   CHRONIC DM, Type 2: Reports no concerns. Previous readings A1c 6.1 CBGs: Avg 130-138, Low >100, High < 170. Checks CBGs 1-2x daily, fasting AM and PM Meds: Metformin XR 500mg  daily (intolerance GI on higher doses of metformin) Reports good compliance. Tolerating well w/o side-effects Currently on ACEi Lifestyle: - Diet (improving DM diet)  - Limited regular exercise Denies hypoglycemia, polyuria, visual changes, numbness or tingling.  CHRONIC HTN: Reports no new concerns. Has checked BP recently and also had Urgent Care had reading 128/80 Current Meds - Lisinopril 10mg  daily, Metoprolol XL 25mg  daily   Reports good compliance, took meds today. Tolerating well, w/o complaints. Denies CP, dyspnea, HA, edema, dizziness / lightheadedness  Additional complaints / updates - She has upcoming MRI with Neurology - History of post-menopausal hormonal hot flashes / night sweats. Surgical hysterectomy 3 years  ago, still has episodes frequently. She used to take Gabapentin for this or other reasons and it caused her to be groggy could not tolerate. She says she recalls taking Paxil for anxiety in past but long time ago. She is interested to consider these meds in future.   Depression screen [facility] 2/9 08/10/2018 04/07/2017  Decreased Interest 0 0  Down, Depressed, Hopeless 0 0  PHQ - 2 Score 0 0  Altered sleeping - 2  Tired, decreased energy - 2  Change in appetite - 3  Feeling bad or failure about yourself  - 0  Trouble concentrating - 1  Moving slowly or fidgety/restless - 0  Suicidal thoughts - 0  PHQ-9 Score - 8  Difficult doing work/chores - Somewhat difficult    Social History   Tobacco Use  . Smoking status: Never Smoker  . Smokeless tobacco: Never Used  Substance Use Topics  . Alcohol use: No  . Drug use: No    Review of Systems Per HPI unless specifically indicated above     Objective:    There were no vitals taken for this visit.  Wt Readings from Last 3 Encounters:  04/25/18 198 lb 9.6 oz (90.1 kg)  01/25/18 205 lb 6.4 oz (93.2 kg)  11/22/17 196 lb 8 oz (89.1 kg)    Physical Exam   Note examination was completely remotely via video observation objective data only  Gen - well-appearing, no acute distress or apparent pain, comfortable HEENT - eyes appear clear without discharge or redness Heart/Lungs - cannot examine virtually - observed no evidence of coughing or labored breathing. Abd - cannot examine virtually  Skin - face visible today-  no rash Neuro - awake, alert, oriented Psych - not anxious appearing   Results for orders placed or performed in visit on 04/25/18  Lipid panel  Result Value Ref Range   Cholesterol, Total 206 (H) 100 - 199 mg/dL   Triglycerides 578192 (H) 0 - 149 mg/dL   HDL 48 >46>39 mg/dL   VLDL Cholesterol Cal 38 5 - 40 mg/dL   LDL Calculated 962120 (H) 0 - 99 mg/dL   Chol/HDL Ratio 4.3 0.0 - 4.4 ratio  Hemoglobin A1c  Result Value Ref Range    Hgb A1c MFr Bld 6.5 (H) 4.8 - 5.6 %   Est. average glucose Bld gHb Est-mCnc 140 mg/dL  Comprehensive metabolic panel  Result Value Ref Range   Glucose 108 (H) 65 - 99 mg/dL   BUN 11 6 - 24 mg/dL   Creatinine, Ser 9.520.84 0.57 - 1.00 mg/dL   GFR calc non Af Amer 80 >59 mL/min/1.73   GFR calc Af Amer 92 >59 mL/min/1.73   BUN/Creatinine Ratio 13 9 - 23   Sodium 140 134 - 144 mmol/L   Potassium 4.9 3.5 - 5.2 mmol/L   Chloride 100 96 - 106 mmol/L   CO2 24 20 - 29 mmol/L   Calcium 10.1 8.7 - 10.2 mg/dL   Total Protein 7.2 6.0 - 8.5 g/dL   Albumin 4.9 3.8 - 4.9 g/dL   Globulin, Total 2.3 1.5 - 4.5 g/dL   Albumin/Globulin Ratio 2.1 1.2 - 2.2   Bilirubin Total 0.6 0.0 - 1.2 mg/dL   Alkaline Phosphatase 82 39 - 117 IU/L   AST 21 0 - 40 IU/L   ALT 35 (H) 0 - 32 IU/L  CBC with Differential/Platelet  Result Value Ref Range   WBC 6.2 3.4 - 10.8 x10E3/uL   RBC 4.88 3.77 - 5.28 x10E6/uL   Hemoglobin 14.0 11.1 - 15.9 g/dL   Hematocrit 84.143.1 32.434.0 - 46.6 %   MCV 88 79 - 97 fL   MCH 28.7 26.6 - 33.0 pg   MCHC 32.5 31.5 - 35.7 g/dL   RDW 40.113.8 02.711.7 - 25.315.4 %   Platelets 355 150 - 450 x10E3/uL   Neutrophils 57 Not Estab. %   Lymphs 36 Not Estab. %   Monocytes 5 Not Estab. %   Eos 1 Not Estab. %   Basos 1 Not Estab. %   Neutrophils Absolute 3.6 1.4 - 7.0 x10E3/uL   Lymphocytes Absolute 2.2 0.7 - 3.1 x10E3/uL   Monocytes Absolute 0.3 0.1 - 0.9 x10E3/uL   EOS (ABSOLUTE) 0.0 0.0 - 0.4 x10E3/uL   Basophils Absolute 0.0 0.0 - 0.2 x10E3/uL   Immature Granulocytes 0 Not Estab. %   Immature Grans (Abs) 0.0 0.0 - 0.1 x10E3/uL  POCT glycosylated hemoglobin (Hb A1C)  Result Value Ref Range   Hemoglobin A1C 6.4 (A) 4.0 - 5.6 %   HbA1c POC (<> result, manual entry)     HbA1c, POC (prediabetic range)     HbA1c, POC (controlled diabetic range)        Assessment & Plan:   Problem List Items Addressed This Visit    Essential hypertension    Controlled HTN currently - Home BP readings normal  No  known complications     Plan:  1. Continue current BP regimen - Lisinopril 10mg  daily, Metoprolol XL 25mg  daily 2. Encourage improved lifestyle - low sodium diet, regular exercise 3. Continue monitor BP outside office, bring readings to next visit, if persistently >140/90 or new symptoms notify office  sooner      Type 2 diabetes mellitus without complication, without long-term current use of insulin (HCC) - Primary    Well-controlled DM with A1c 6s previously, due for A1c but agree to defer for 3 months since not needed to come in to office now due to coronavirus pandemic No known complications or hypoglycemia.  Plan:  1. Continue current therapy - Metformin XR  daily - future may reduce if A1c in 5 range 2. Encourage improved lifestyle - low carb, low sugar diet, reduce portion size, continue improving regular exercise 3. Check CBG, bring log to next visit for review 4. Continue ASA ACEi, Statin 5. Follow-up 3 months for DM A1c       Vasomotor symptoms due to menopause    Secondary to surgical menopause hysterectomy Chronic problem >3 years episodic hot flashes vasomotor symptoms In past failed gabapentin side effect intolerance  Plan - Reviewed other med options such as Paxil, Effexor, she is interested in Paxil will consider this further first before ready to start, will review at next apt w/ PCP. We did not fully discuss other options such as herbal remedy. Also did not address hormone replacement therapy - advised that this was not recommended         No orders of the defined types were placed in this encounter.   Follow-up: - Return in 3 months for DM A1c / HTN / Hot Flashes with PCP  Patient verbalizes understanding with the above medical recommendations including the limitation of remote medical advice.  Specific follow-up and call-back criteria were given for patient to follow-up or seek medical care more urgently if needed.  Total duration of direct patient  care provided via video conference: 15 minutes   Saralyn Pilar, DO [facility] 08/10/2018, 9:54 AM

## 2018-08-10 NOTE — Assessment & Plan Note (Addendum)
Controlled HTN currently - Home BP readings normal  No known complications     Plan:  1. Continue current BP regimen - Lisinopril 10mg  daily, Metoprolol XL 25mg  daily 2. Encourage improved lifestyle - low sodium diet, regular exercise 3. Continue monitor BP outside office, bring readings to next visit, if persistently >140/90 or new symptoms notify office sooner

## 2018-08-10 NOTE — Assessment & Plan Note (Signed)
Well-controlled DM with A1c 6s previously, due for A1c but agree to defer for 3 months since not needed to come in to office now due to coronavirus pandemic No known complications or hypoglycemia.  Plan:  1. Continue current therapy - Metformin XR 500mg  daily - future may reduce if A1c in 5 range 2. Encourage improved lifestyle - low carb, low sugar diet, reduce portion size, continue improving regular exercise 3. Check CBG, bring log to next visit for review 4. Continue ASA ACEi, Statin 5. Follow-up 3 months for DM A1c

## 2018-08-10 NOTE — Patient Instructions (Addendum)
Thank you for coming to the office today.  Keep up the good work overall. If blood sugar keeps improving in future, can discuss reducing or stop metformin w/ Lauren.  For hotflashes I would consider Paxil or Effexor (Venlafaxine) these are mood/anxiety med that can work on chemistry that reduces triggering hot flashes can be beneficial.  Next visit will get fingerstick blood sugar in office with Lauren.  Please schedule a Follow-up Appointment to: Return in about 3 months (around 11/10/2018) for DM A1c / HTN / Hot Flashes with PCP.  If you have any other questions or concerns, please feel free to call the office or send a message through MyChart. You may also schedule an earlier appointment if necessary.  Additionally, you may be receiving a survey about your experience at our office within a few days to 1 week by e-mail or mail. We value your feedback.  Saralyn Pilar, DO [facility], [place]

## 2018-08-10 NOTE — Assessment & Plan Note (Signed)
Secondary to surgical menopause hysterectomy Chronic problem >3 years episodic hot flashes vasomotor symptoms In past failed gabapentin side effect intolerance  Plan - Reviewed other med options such as Paxil, Effexor, she is interested in Paxil will consider this further first before ready to start, will review at next apt w/ PCP. We did not fully discuss other options such as herbal remedy. Also did not address hormone replacement therapy - advised that this was not recommended

## 2018-09-12 ENCOUNTER — Encounter: Payer: Self-pay | Admitting: Nurse Practitioner

## 2018-09-14 ENCOUNTER — Encounter: Payer: Self-pay | Admitting: Family Medicine

## 2018-09-14 ENCOUNTER — Other Ambulatory Visit: Payer: Self-pay

## 2018-09-14 ENCOUNTER — Ambulatory Visit (INDEPENDENT_AMBULATORY_CARE_PROVIDER_SITE_OTHER): Payer: Managed Care, Other (non HMO) | Admitting: Family Medicine

## 2018-09-14 DIAGNOSIS — Z20828 Contact with and (suspected) exposure to other viral communicable diseases: Secondary | ICD-10-CM | POA: Diagnosis not present

## 2018-09-14 DIAGNOSIS — I471 Supraventricular tachycardia: Secondary | ICD-10-CM

## 2018-09-14 DIAGNOSIS — I479 Paroxysmal tachycardia, unspecified: Secondary | ICD-10-CM

## 2018-09-14 DIAGNOSIS — Z20822 Contact with and (suspected) exposure to covid-19: Secondary | ICD-10-CM

## 2018-09-14 DIAGNOSIS — E119 Type 2 diabetes mellitus without complications: Secondary | ICD-10-CM | POA: Diagnosis not present

## 2018-09-14 DIAGNOSIS — F4322 Adjustment disorder with anxiety: Secondary | ICD-10-CM

## 2018-09-14 NOTE — Progress Notes (Signed)
Virtual Visit via Telephone The purpose of this virtual visit is to provide medical care while limiting exposure to the novel coronavirus (COVID19) for both patient and office staff.  Consent was obtained for phone visit:  Yes.   Answered questions that patient had about telehealth interaction:  Yes.   I discussed the limitations, risks, security and privacy concerns of performing an evaluation and management service by telephone. I also discussed with the patient that there may be a patient responsible charge related to this service. The patient expressed understanding and agreed to proceed.  Patient Location: Home Provider Location: [facility] (Office)  PCP is Wilhelmina McardleLauren Kennedy, AGPCNP-BC - I am currently covering during her maternity leave.   ---------------------------------------------------------------------- Chief Complaint  Patient presents with  . Consult    FMLA paperwork for COVID-19  . Diabetes    S: Reviewed CMA documentation. I have called patient and gathered additional HPI as follows:   EXPOSURE TO COVID19 Type 2 Diabetes History of Supraventricular Tachycardia (SVT), Paroxysmal Tachycardia Reports possible exposure at work, coworker 1 week before June 15th, approx 08/22/18 started with exposure, and that coworker was out for testing while their family member tested positive. - Out on 08/29/18 (due to testing MRI), then patient returned to work on 08/30/18. She was concerned about still possible exposure to coworker with exposure to COVID19 at work that time. - She is concerned about her medical history increasing her risk of COVID19 complication. - Currently she is asymptomatic. She has remained out of work since 08/31/18 due to this precaution. - Regarding Diabetes, last Lab A1c 6.5 in 04/2018. She has remained well controlled. - She takes Metoprolol XL 25mg  daily to help control BP and prevent complication from future tachycarrhythmia  - Additionally with  adjustment anxiety to life stressors with COVID19  Denies any high risk travel to areas of current concern for COVID19. Admits the exposure at work with coworker who had positive family member, and her coworker was tested. Denies any other known exposure.  Denies any fevers, chills, sweats, body ache, cough, shortness of breath, sinus pain or pressure, headache, abdominal pain, diarrhea  Past Medical History:  Diagnosis Date  . Anemia    before hysterectomy  . Anxiety   . Attention deficit disorder (ADD)   . Cardiac arrhythmia    SVT - followed by PCP  . Depression   . Nephrolithiasis   . PTSD (post-traumatic stress disorder)    Social History   Tobacco Use  . Smoking status: Never Smoker  . Smokeless tobacco: Never Used  Substance Use Topics  . Alcohol use: No  . Drug use: No    Current Outpatient Medications:  .  aspirin 81 MG tablet, Take 81 mg by mouth daily., Disp: , Rfl:  .  atorvastatin (LIPITOR) 40 MG tablet, Take 1 tablet (40 mg total) by mouth daily at 6 PM., Disp: 90 tablet, Rfl: 1 .  butalbital-acetaminophen-caffeine (FIORICET, ESGIC) 50-325-40 MG tablet, Take 0.5-1 tablets by mouth 2 (two) times daily as needed for migraine., Disp: 14 tablet, Rfl: 2 .  Cholecalciferol (VITAMIN D3) 10000 units TABS, Take by mouth., Disp: , Rfl:  .  lisinopril (PRINIVIL,ZESTRIL) 10 MG tablet, Take 1 tablet (10 mg total) by mouth daily., Disp: 90 tablet, Rfl: 1 .  metFORMIN (GLUCOPHAGE-XR) 500 MG 24 hr tablet, TAKE 1 TABLET(500 MG) BY MOUTH DAILY WITH BREAKFAST, Disp: 90 tablet, Rfl: 1 .  metoprolol succinate (TOPROL-XL) 25 MG 24 hr tablet, Take 1 tablet (25 mg  total) by mouth daily., Disp: 90 tablet, Rfl: 1 .  Multiple Vitamin (MULTIVITAMIN) tablet, Take 1 tablet by mouth daily., Disp: , Rfl:  .  omeprazole (PRILOSEC) 20 MG capsule, Take 1 capsule (20 mg total) by mouth every other day., Disp: 90 capsule, Rfl: 1 .  tiZANidine (ZANAFLEX) 2 MG tablet, Take 1 tablet by mouth at  bedtime., Disp: , Rfl:   Depression screen [facility] 2/9 08/10/2018 04/07/2017  Decreased Interest 0 0  Down, Depressed, Hopeless 0 0  PHQ - 2 Score 0 0  Altered sleeping - 2  Tired, decreased energy - 2  Change in appetite - 3  Feeling bad or failure about yourself  - 0  Trouble concentrating - 1  Moving slowly or fidgety/restless - 0  Suicidal thoughts - 0  PHQ-9 Score - 8  Difficult doing work/chores - Somewhat difficult    No flowsheet data found.  -------------------------------------------------------------------------- O: No physical exam performed due to remote telephone encounter.  Lab results reviewed.  Recent Labs    01/25/18 0820 04/25/18 0820 04/25/18 0919  HGBA1C 6.3* 6.4* 6.5*    No results found for this or any previous visit (from the past 2160 hour(s)).  -------------------------------------------------------------------------- A&P:  Problem List Items Addressed This Visit    Paroxysmal tachycardia (HCC)   SVT (supraventricular tachycardia) ([place])   Type 2 diabetes mellitus without complication, without long-term current use of insulin ([place])    Other Visit Diagnoses    Exposure to Covid-19 Virus    -  Primary   Relevant Orders   MyChart COVID-19 home monitoring program   Temperature monitoring   Acute adjustment disorder with anxiety         Clinically with medical conditions with Type 2 Diabetes and Tachy-Arrhythmia (SVT) that would make this patient high risk for complications from [id number]. - Concern with possible exposure at work, however >2 weeks has passed since that initial exposure, concern that she may be at risk for future exposures if continues to work - Additionally with anxiety related to risk of COVID19 has flared up secondary to stressors  Complete her requested short term disability form and submit to [place]  Her return to work plan is return full duty w/o restrictions on 09/28/18.  No orders of the defined types were placed in this  encounter.   Follow-up: as needed  Patient verbalizes understanding with the above medical recommendations including the limitation of remote medical advice.  Specific follow-up and call-back criteria were given for patient to follow-up or seek medical care more urgently if needed.   - Time spent in direct consultation with patient on phone: 15 minutes  Nobie Putnam, [facility] 09/14/2018, 8:43 AM

## 2018-09-14 NOTE — Patient Instructions (Signed)
Will your paperwork once it is completed.

## 2018-09-15 ENCOUNTER — Encounter (INDEPENDENT_AMBULATORY_CARE_PROVIDER_SITE_OTHER): Payer: Self-pay

## 2018-09-16 ENCOUNTER — Encounter (INDEPENDENT_AMBULATORY_CARE_PROVIDER_SITE_OTHER): Payer: Self-pay

## 2018-09-17 ENCOUNTER — Encounter (INDEPENDENT_AMBULATORY_CARE_PROVIDER_SITE_OTHER): Payer: Self-pay

## 2018-09-18 ENCOUNTER — Encounter (INDEPENDENT_AMBULATORY_CARE_PROVIDER_SITE_OTHER): Payer: Self-pay

## 2018-09-19 ENCOUNTER — Encounter (INDEPENDENT_AMBULATORY_CARE_PROVIDER_SITE_OTHER): Payer: Self-pay

## 2018-09-20 ENCOUNTER — Encounter (INDEPENDENT_AMBULATORY_CARE_PROVIDER_SITE_OTHER): Payer: Self-pay

## 2018-09-21 ENCOUNTER — Encounter (INDEPENDENT_AMBULATORY_CARE_PROVIDER_SITE_OTHER): Payer: Self-pay

## 2018-09-22 ENCOUNTER — Encounter (INDEPENDENT_AMBULATORY_CARE_PROVIDER_SITE_OTHER): Payer: Self-pay

## 2018-09-23 ENCOUNTER — Encounter (INDEPENDENT_AMBULATORY_CARE_PROVIDER_SITE_OTHER): Payer: Self-pay

## 2018-09-24 ENCOUNTER — Encounter (INDEPENDENT_AMBULATORY_CARE_PROVIDER_SITE_OTHER): Payer: Self-pay

## 2018-09-25 ENCOUNTER — Encounter (INDEPENDENT_AMBULATORY_CARE_PROVIDER_SITE_OTHER): Payer: Self-pay

## 2018-09-26 ENCOUNTER — Encounter (INDEPENDENT_AMBULATORY_CARE_PROVIDER_SITE_OTHER): Payer: Self-pay

## 2018-09-26 ENCOUNTER — Telehealth: Payer: Self-pay

## 2018-09-26 NOTE — Telephone Encounter (Signed)
Received return call from patient regarding Mychart Covid questionnaire. Patient reports new cough but denies any chest pain, difficulty breathing, or fever.  She states, "It could be my allergies."  Advised to treat with OTC medication and to call PCP if symptoms worsen.  Patient states understanding.

## 2018-09-27 ENCOUNTER — Encounter (INDEPENDENT_AMBULATORY_CARE_PROVIDER_SITE_OTHER): Payer: Self-pay

## 2018-10-17 ENCOUNTER — Other Ambulatory Visit: Payer: Self-pay | Admitting: Nurse Practitioner

## 2018-10-17 DIAGNOSIS — E119 Type 2 diabetes mellitus without complications: Secondary | ICD-10-CM

## 2018-11-09 ENCOUNTER — Encounter: Payer: Self-pay | Admitting: Nurse Practitioner

## 2018-11-16 ENCOUNTER — Encounter: Payer: Self-pay | Admitting: Nurse Practitioner

## 2018-11-16 NOTE — Progress Notes (Signed)
Call placed to patient to request additional information about FMLA leave extension:  Patient did not answer.  VM left.  Message also sent to [place].

## 2018-11-24 ENCOUNTER — Encounter: Payer: Self-pay | Admitting: Nurse Practitioner

## 2019-01-12 ENCOUNTER — Other Ambulatory Visit: Payer: Self-pay | Admitting: Nurse Practitioner

## 2019-01-12 DIAGNOSIS — E119 Type 2 diabetes mellitus without complications: Secondary | ICD-10-CM

## 2019-01-12 DIAGNOSIS — I10 Essential (primary) hypertension: Secondary | ICD-10-CM

## 2019-01-13 ENCOUNTER — Other Ambulatory Visit: Payer: Self-pay | Admitting: Family Medicine

## 2019-01-19 ENCOUNTER — Encounter: Payer: Self-pay | Admitting: Nurse Practitioner

## 2019-07-28 IMAGING — MR MR HEAD W/O CM
11 series · 44 of 48 positions shown · non-contrast
Comparison: CT head 07/19/2013

CLINICAL DATA: Ataxia.  Question Parkinson's.

EXAM:
MRI HEAD WITHOUT CONTRAST
TECHNIQUE: Multiplanar, multiecho pulse sequences of the brain and surrounding
structures were obtained without intravenous contrast.

[Series 5: T1 · sagittal · 5.0mm · 0.62mm/px · 3 of 25 slices shown (1 of 2)]
[im 1/25]
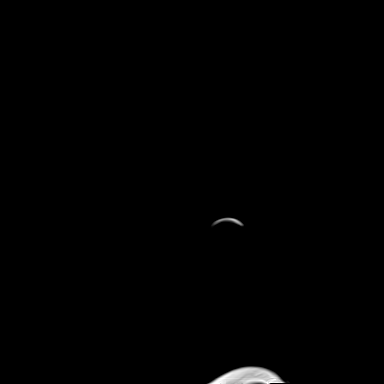
[im 13/25]
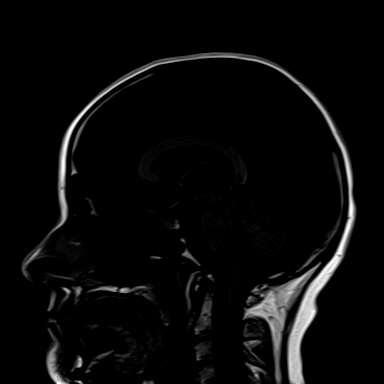
[im 25/25]
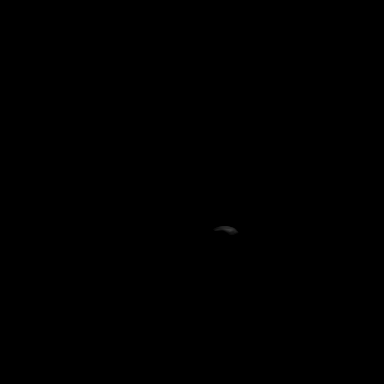

[Series 6: ax dwi_tracew · axial · 3.0mm · 0.73mm/px · z∈[-76,+86]mm · 4 of 55 slices shown]
[im 1/55]
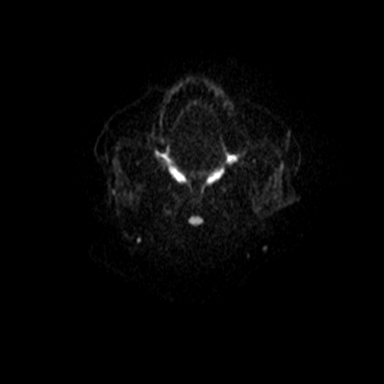
[im 19/55]
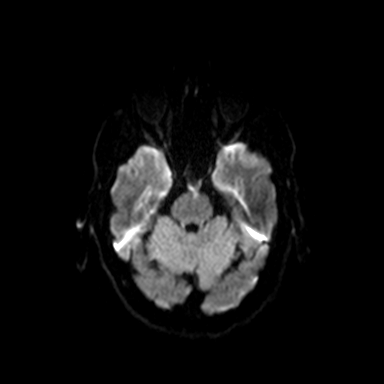
[im 37/55]
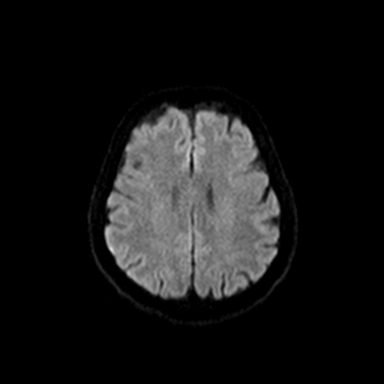
[im 55/55]
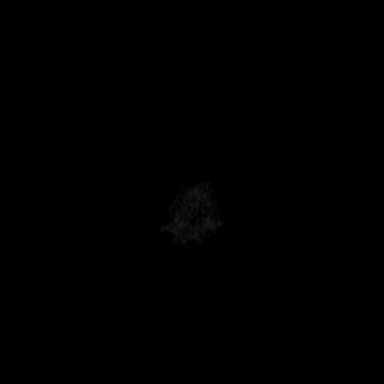

[Series 7: ax dwi_adc · axial · 3.0mm · 0.73mm/px · z∈[-76,+86]mm · 4 of 55 slices shown]
[im 1/55]
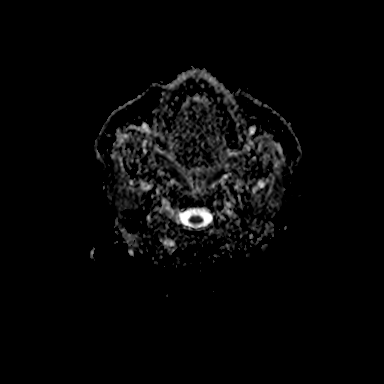
[im 19/55]
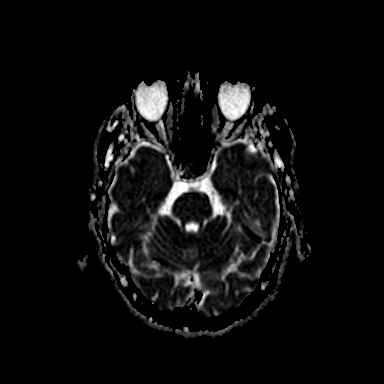
[im 37/55]
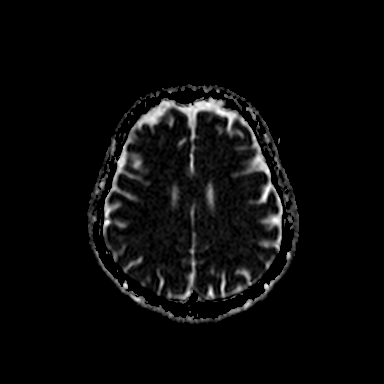
[im 55/55]
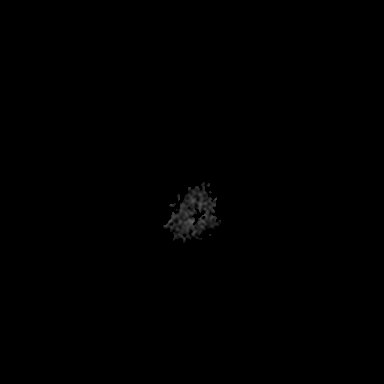

[Series 8: cor dwi_tracew · coronal · 5.0mm · 0.60mm/px · 3 of 45 slices shown]
[im 1/45]
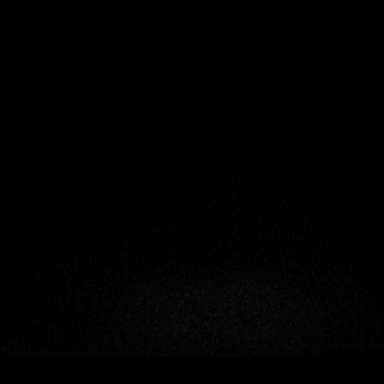
[im 23/45]
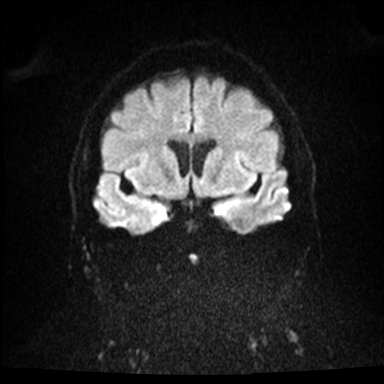
[im 45/45]
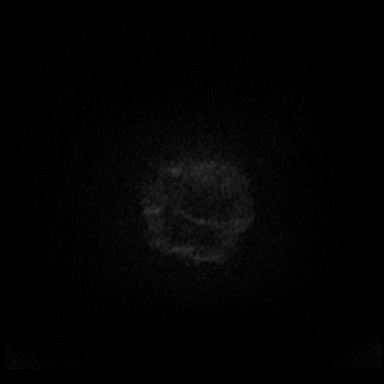

[Series 9: cor dwi_adc · coronal · 5.0mm · 0.60mm/px · 3 of 44 slices shown]
[im 1/44]
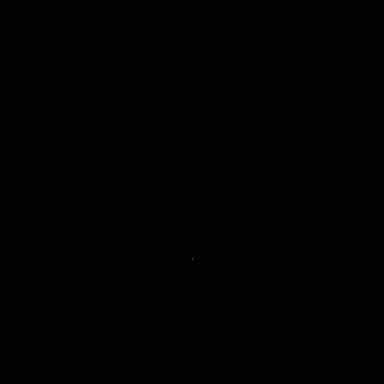
[im 22/44]
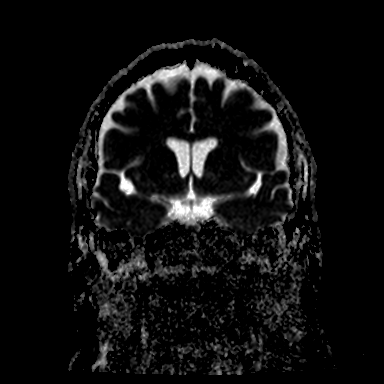
[im 44/44]
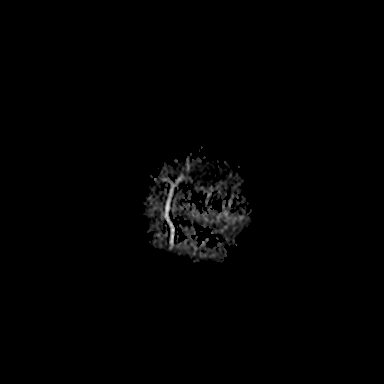

[Series 10: T2 · axial · 5.0mm · 0.53mm/px · z∈[-65,+79]mm · 2 of 25 slices shown (1 of 2)]
[im 1/25]
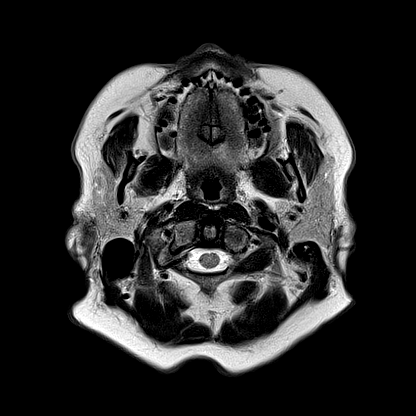
[im 25/25]
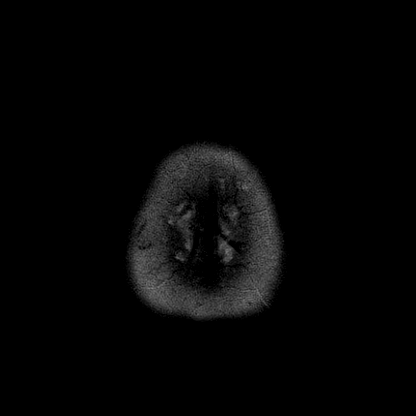

[Series 11: swi_images · axial · 3.0mm · 0.90mm/px · z∈[-82,+95]mm · 5 of 60 slices shown]
[im 1/60]
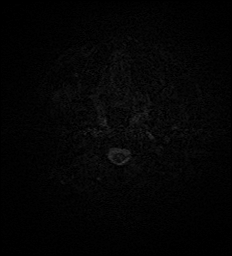
[im 15/60]
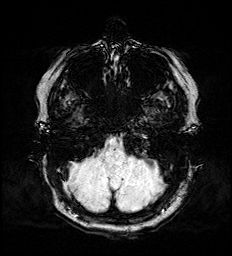
[im 30/60]
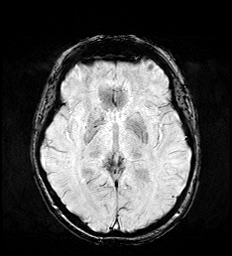
[im 45/60]
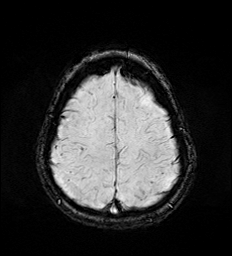
[im 60/60]
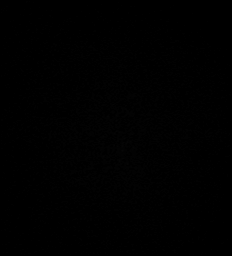

[Series 12: mip_images(sw) · axial · 24.0mm · 0.90mm/px · z∈[-71,+84]mm · 4 of 53 slices shown]
[im 1/53]
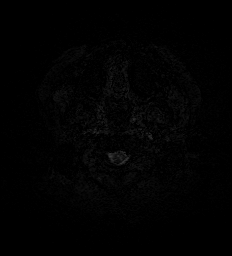
[im 18/53]
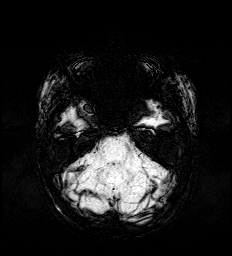
[im 35/53]
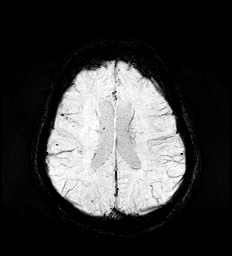
[im 53/53]
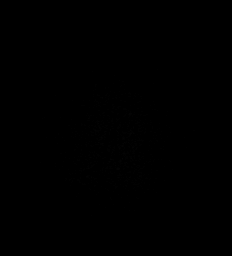

[Series 13: FLAIR · axial · 3.0mm · 0.53mm/px · z∈[-74,+88]mm · 4 of 55 slices shown]
[im 1/55]
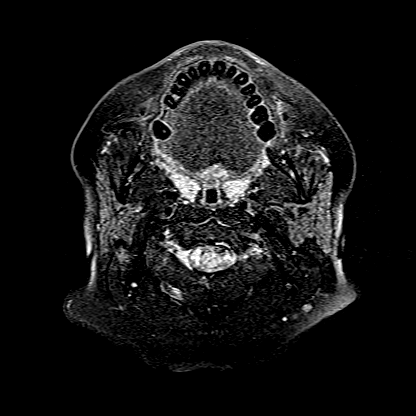
[im 19/55]
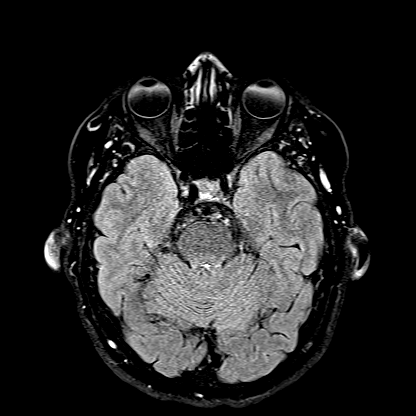
[im 37/55]
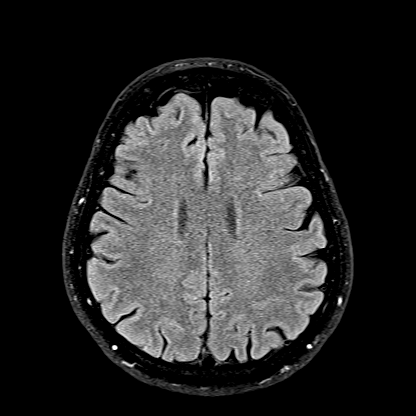
[im 55/55]
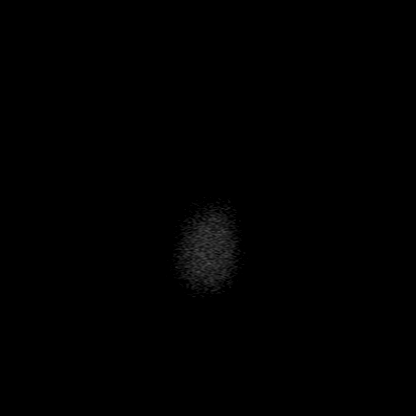

[Series 14: T1 · axial · 1.0mm · 0.98mm/px · z∈[-82,+93]mm · 10 of 176 slices shown (2 of 2)]
[im 1/176]
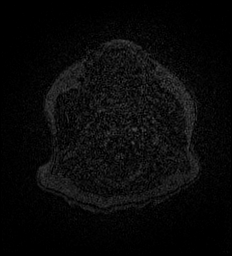
[im 14/176]
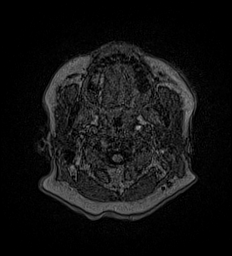
[im 27/176]
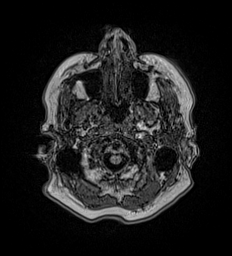
[im 41/176]
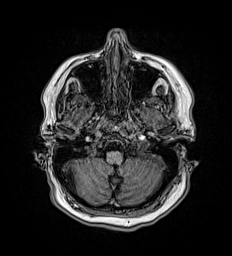
[im 54/176]
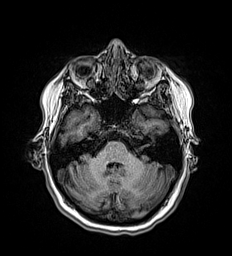
[im 81/176]
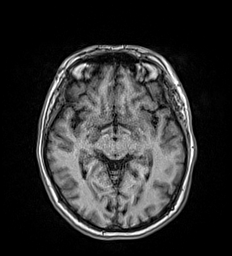
[im 95/176]
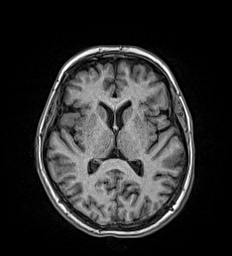
[im 122/176]
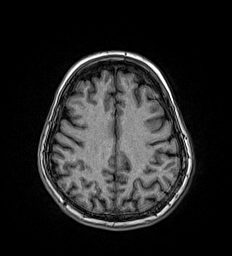
[im 149/176]
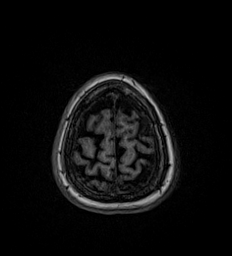
[im 176/176]
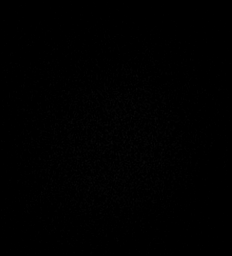

[Series 15: T2 · coronal · 5.0mm · 0.57mm/px · 2 of 29 slices shown (2 of 2)]
[im 1/29]
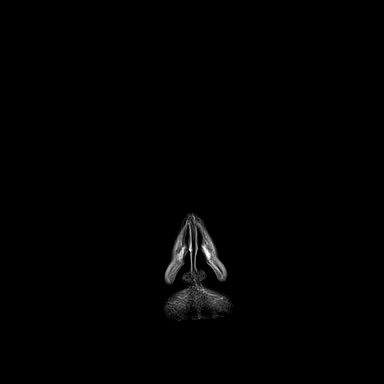
[im 29/29]
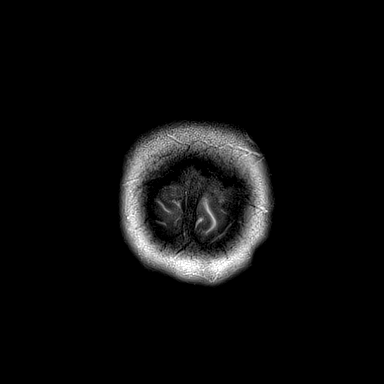

[44 of 48 positions shown; findings below may reference images not displayed]

FINDINGS: Brain: No acute infarction, hemorrhage, hydrocephalus, extra-axial
collection or mass lesion. Mild atrophy. Several small
hyperintensities in the frontal white matter bilaterally appear
chronic.

Vascular: Normal arterial flow voids

Skull and upper cervical spine: Negative

Sinuses/Orbits: Negative

Other: None
IMPRESSION: No acute abnormality. Mild atrophy and mild chronic white matter
changes likely due to chronic microvascular ischemia.
